# Patient Record
Sex: Male | Born: 1965 | Race: White | Hispanic: No | Marital: Married | Smoking: Never smoker
Health system: Southern US, Community
[De-identification: ages and names within clinical notes are randomized; demographics above are authoritative.]

## PROBLEM LIST (undated history)

## (undated) DIAGNOSIS — M1990 Unspecified osteoarthritis, unspecified site: Secondary | ICD-10-CM

## (undated) DIAGNOSIS — E079 Disorder of thyroid, unspecified: Secondary | ICD-10-CM

## (undated) DIAGNOSIS — I10 Essential (primary) hypertension: Secondary | ICD-10-CM

## (undated) DIAGNOSIS — T7840XA Allergy, unspecified, initial encounter: Secondary | ICD-10-CM

## (undated) DIAGNOSIS — N189 Chronic kidney disease, unspecified: Secondary | ICD-10-CM

## (undated) HISTORY — DX: Unspecified osteoarthritis, unspecified site: M19.90

## (undated) HISTORY — DX: Disorder of thyroid, unspecified: E07.9

## (undated) HISTORY — DX: Chronic kidney disease, unspecified: N18.9

## (undated) HISTORY — DX: Allergy, unspecified, initial encounter: T78.40XA

---

## 1998-06-23 HISTORY — PX: ANTERIOR CRUCIATE LIGAMENT REPAIR: SHX115

## 2011-11-25 ENCOUNTER — Encounter: Payer: BC Managed Care – PPO | Attending: "Endocrinology | Admitting: *Deleted

## 2011-11-25 DIAGNOSIS — Z713 Dietary counseling and surveillance: Secondary | ICD-10-CM

## 2011-11-29 NOTE — Progress Notes (Signed)
Patient attended basic nutrition class on 11/25/11.  Topics covered include:   1. Complications of Hyperlipidemia and/or Hypertension. 2. Ways to reduce risk of heart disease.  3. Identifying fat and sodium content on food labels. 4. Ways to decrease sodium intake. 5. Optimal amount of daily saturated fat intake. 6. Optimal amount of daily sodium intake.  7. Foods to limit/avoid on a heart healthy diet. 8. MyPlate and portion control.   Patient to follow-up with [facility] prn.  

## 2012-07-13 ENCOUNTER — Ambulatory Visit (INDEPENDENT_AMBULATORY_CARE_PROVIDER_SITE_OTHER): Payer: BC Managed Care – PPO | Admitting: Internal Medicine

## 2012-07-13 ENCOUNTER — Encounter: Payer: Self-pay | Admitting: Internal Medicine

## 2012-07-13 VITALS — BP 130/84 | HR 81 | Temp 98.5°F | Resp 18 | Ht 72.5 in | Wt 226.0 lb

## 2012-07-13 DIAGNOSIS — Z0000 Encounter for general adult medical examination without abnormal findings: Secondary | ICD-10-CM

## 2012-07-13 DIAGNOSIS — I10 Essential (primary) hypertension: Secondary | ICD-10-CM | POA: Insufficient documentation

## 2012-07-13 DIAGNOSIS — E039 Hypothyroidism, unspecified: Secondary | ICD-10-CM

## 2012-07-13 LAB — TSH: TSH: 4.84 u[IU]/mL (ref 0.35–5.50)

## 2012-07-13 MED ORDER — IRBESARTAN 300 MG PO TABS: 300.0000 mg | ORAL_TABLET | Freq: Every day | ORAL | Status: DC

## 2012-07-13 NOTE — Patient Instructions (Signed)
Limit your sodium (Salt) intake  Please check your blood pressure on a regular basis.  If it is consistently greater than 150/90, please make an office appointment.    It is important that you exercise regularly, at least 20 minutes 3 to 4 times per week.  If you develop chest pain or shortness of breath seek  medical attention.  You need to lose weight.  Consider a lower calorie diet and regular exercise.DASH Diet The DASH diet stands for "Dietary Approaches to Stop Hypertension." It is a healthy eating plan that has been shown to reduce high blood pressure (hypertension) in as little as 14 days, while also possibly providing other significant health benefits. These other health benefits include reducing the risk of breast cancer after menopause and reducing the risk of type 2 diabetes, heart disease, colon cancer, and stroke. Health benefits also include weight loss and slowing kidney failure in patients with chronic kidney disease.   DIET GUIDELINES  Limit salt (sodium). Your diet should contain less than 1500 mg of sodium daily.   Limit refined or processed carbohydrates. Your diet should include mostly whole grains. Desserts and added sugars should be used sparingly.   Include small amounts of heart-healthy fats. These types of fats include nuts, oils, and tub margarine. Limit saturated and trans fats. These fats have been shown to be harmful in the body.  CHOOSING FOODS   The following food groups are based on a 2000 calorie diet. See your Registered Dietitian for individual calorie needs. Grains and Grain Products (6 to 8 servings daily)  Eat More Often: Whole-wheat bread, brown rice, whole-grain or wheat pasta, quinoa, popcorn without added fat or salt (air popped).   Eat Less Often: White bread, white pasta, white rice, cornbread.  Vegetables (4 to 5 servings daily)  Eat More Often: Fresh, frozen, and canned vegetables. Vegetables may be raw, steamed, roasted, or grilled with a  minimal amount of fat.   Eat Less Often/Avoid: Creamed or fried vegetables. Vegetables in a cheese sauce.  Fruit (4 to 5 servings daily)  Eat More Often: All fresh, canned (in natural juice), or frozen fruits. Dried fruits without added sugar. One hundred percent fruit juice ( cup [237 mL] daily).   Eat Less Often: Dried fruits with added sugar. Canned fruit in light or heavy syrup.  Lean Meats, Fish, and Poultry (2 servings or less daily. One serving is 3 to 4 oz [85-114 g]).  Eat More Often: Ninety percent or leaner ground beef, tenderloin, sirloin. Round cuts of beef, chicken breast, turkey breast. All fish. Grill, bake, or broil your meat. Nothing should be fried.   Eat Less Often/Avoid: Fatty cuts of meat, turkey, or chicken leg, thigh, or wing. Fried cuts of meat or fish.  Dairy (2 to 3 servings)  Eat More Often: Low-fat or fat-free milk, low-fat plain or light yogurt, reduced-fat or part-skim cheese.   Eat Less Often/Avoid: Milk (whole, 2%). Whole milk yogurt. Full-fat cheeses.  Nuts, Seeds, and Legumes (4 to 5 servings per week)  Eat More Often: All without added salt.   Eat Less Often/Avoid: Salted nuts and seeds, canned beans with added salt.  Fats and Sweets (limited)  Eat More Often: Vegetable oils, tub margarines without trans fats, sugar-free gelatin. Mayonnaise and salad dressings.   Eat Less Often/Avoid: Coconut oils, palm oils, butter, stick margarine, cream, half and half, cookies, candy, pie.  FOR MORE INFORMATION The Dash Diet Eating Plan: www.dashdiet.org Document Released: 05/29/2011 Document Revised: 09/01/2011 Document   Reviewed: 05/29/2011 [employer] Patient Information 2013 [employer].    

## 2012-07-13 NOTE — Progress Notes (Signed)
Subjective:    Patient ID: Steve Olson, male    DOB: 11-Mar-1966, 47 y.o.   MRN: [id number]  HPI  47 year old patient who is seen today to establish with our practice. He has a history of treated hypertension of about one years duration. The well today without concerns or complaints. There's been some weight gain over the past year.  Past medical history is pertinent for hypertension. 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 5 children. Nonsmoker. Rare social drinker; no regular exercise  Family history father age 67  has a history of coronary artery disease paternal grandfather had an MI at age 46 mother age 31 with history of breast cancer one brother who has obesity    Review of Systems  Constitutional: Positive for unexpected weight change. Negative for fever, chills, activity change, appetite change and fatigue.  HENT: Negative for hearing loss, ear pain, congestion, rhinorrhea, sneezing, mouth sores, trouble swallowing, neck pain, neck stiffness, dental problem, voice change, sinus pressure and tinnitus.   Eyes: Negative for photophobia, pain, redness and visual disturbance.  Respiratory: Negative for apnea, cough, choking, chest tightness, shortness of breath and wheezing.   Cardiovascular: Negative for chest pain, palpitations and leg swelling.  Gastrointestinal: Negative for nausea, vomiting, abdominal pain, diarrhea, constipation, blood in stool, abdominal distention, anal bleeding and rectal pain.  Genitourinary: Negative for dysuria, urgency, frequency, hematuria, flank pain, decreased urine volume, discharge, penile swelling, scrotal swelling, difficulty urinating, genital sores and testicular pain.  Musculoskeletal: Negative for myalgias, back pain, joint swelling, arthralgias and gait problem.  Skin: Negative for color change,  rash and wound.  Neurological: Negative for dizziness, tremors, seizures, syncope, facial asymmetry, speech difficulty, weakness, light-headedness, numbness and headaches.  Hematological: Negative for adenopathy. Does not bruise/bleed easily.  Psychiatric/Behavioral: Negative for suicidal ideas, hallucinations, behavioral problems, confusion, sleep disturbance, self-injury, dysphoric mood, decreased concentration and agitation. The patient is not nervous/anxious.        Objective:   Physical Exam  Constitutional: He appears well-developed and well-nourished.       140/84  HENT:  Head: Normocephalic and atraumatic.  Right Ear: External ear normal.  Left Ear: External ear normal.  Nose: Nose normal.  Mouth/Throat: Oropharynx is clear and moist.  Eyes: Conjunctivae normal and EOM are normal. Pupils are equal, round, and reactive to light. No scleral icterus.  Neck: Normal range of motion. Neck supple. No JVD present. No thyromegaly present.  Cardiovascular: Regular rhythm, normal heart sounds and intact distal pulses.  Exam reveals no gallop and no friction rub.   No murmur heard. Pulmonary/Chest: Effort normal and breath sounds normal. He exhibits no tenderness.  Abdominal: Soft. Bowel sounds are normal. He exhibits no distension and no mass. There is no tenderness.  Genitourinary: Prostate normal and penis normal.  Musculoskeletal: Normal range of motion. He exhibits no edema and no tenderness.  Lymphadenopathy:    He has no cervical adenopathy.  Neurological: He is alert. He has normal reflexes. No cranial nerve deficit. Coordination normal.  Skin: Skin is warm and dry. No rash noted.  Psychiatric: He has a normal mood and affect. His behavior is normal.          Assessment & Plan:   Preventive health exam Hypertension. Home blood pressure monitoring encouraged low-salt diet exercise weight loss all recommended History of borderline hypothyroidism. We'll check a TSH  Exogenous obesity

## 2012-08-13 ENCOUNTER — Ambulatory Visit: Payer: BC Managed Care – PPO | Admitting: Internal Medicine

## 2012-08-18 ENCOUNTER — Emergency Department (HOSPITAL_COMMUNITY)
Admission: EM | Admit: 2012-08-18 | Discharge: 2012-08-19 | Disposition: A | Discharge status: Home / Self Care | Payer: BC Managed Care – PPO | Attending: Emergency Medicine | Admitting: Emergency Medicine

## 2012-08-18 DIAGNOSIS — Z79899 Other long term (current) drug therapy: Secondary | ICD-10-CM | POA: Insufficient documentation

## 2012-08-18 DIAGNOSIS — N200 Calculus of kidney: Secondary | ICD-10-CM

## 2012-08-18 DIAGNOSIS — R300 Dysuria: Secondary | ICD-10-CM | POA: Insufficient documentation

## 2012-08-18 DIAGNOSIS — R112 Nausea with vomiting, unspecified: Secondary | ICD-10-CM | POA: Insufficient documentation

## 2012-08-18 DIAGNOSIS — I10 Essential (primary) hypertension: Secondary | ICD-10-CM | POA: Insufficient documentation

## 2012-08-18 HISTORY — DX: Essential (primary) hypertension: I10

## 2012-08-18 LAB — CBC WITH DIFFERENTIAL/PLATELET
Basophils Absolute: 0 K/uL (ref 0.0–0.1)
Basophils Relative: 0 % (ref 0–1)
Eosinophils Absolute: 0.2 K/uL (ref 0.0–0.7)
Eosinophils Relative: 2 % (ref 0–5)
HCT: 41.4 % (ref 39.0–52.0)
Hemoglobin: 15 g/dL (ref 13.0–17.0)
Lymphocytes Relative: 16 % (ref 12–46)
Lymphs Abs: 1.6 K/uL (ref 0.7–4.0)
MCH: 33.5 pg (ref 26.0–34.0)
MCHC: 36.2 g/dL — ABNORMAL HIGH (ref 30.0–36.0)
MCV: 92.4 fL (ref 78.0–100.0)
Monocytes Absolute: 0.8 K/uL (ref 0.1–1.0)
Monocytes Relative: 7 % (ref 3–12)
Neutro Abs: 7.8 K/uL — ABNORMAL HIGH (ref 1.7–7.7)
Neutrophils Relative %: 75 % (ref 43–77)
Platelets: 280 K/uL (ref 150–400)
RBC: 4.48 MIL/uL (ref 4.22–5.81)
RDW: 12.5 % (ref 11.5–15.5)
WBC: 10.3 K/uL (ref 4.0–10.5)

## 2012-08-18 LAB — COMPREHENSIVE METABOLIC PANEL WITH GFR
ALT: 28 U/L (ref 0–53)
AST: 34 U/L (ref 0–37)
Albumin: 4.3 g/dL (ref 3.5–5.2)
Alkaline Phosphatase: 48 U/L (ref 39–117)
BUN: 22 mg/dL (ref 6–23)
CO2: 26 meq/L (ref 19–32)
Calcium: 9.6 mg/dL (ref 8.4–10.5)
Chloride: 100 meq/L (ref 96–112)
Creatinine, Ser: 1.35 mg/dL (ref 0.50–1.35)
GFR calc Af Amer: 72 mL/min — ABNORMAL LOW
GFR calc non Af Amer: 62 mL/min — ABNORMAL LOW
Glucose, Bld: 160 mg/dL — ABNORMAL HIGH (ref 70–99)
Potassium: 4.4 meq/L (ref 3.5–5.1)
Sodium: 137 meq/L (ref 135–145)
Total Bilirubin: 0.7 mg/dL (ref 0.3–1.2)
Total Protein: 7.7 g/dL (ref 6.0–8.3)

## 2012-08-18 LAB — AMYLASE: Amylase: 89 U/L (ref 0–105)

## 2012-08-18 LAB — LIPASE, BLOOD: Lipase: 31 U/L (ref 11–59)

## 2012-08-18 NOTE — ED Notes (Addendum)
Pt states constant LLQ pain that started this morning. Stomach soft. Non tender to palpation. Last BM this morning. C/o burning sensation before or after urination. PT states he feels like he has to void but is not able to. Denies hematuria. Denies penile discharge.

## 2012-08-19 ENCOUNTER — Encounter (HOSPITAL_COMMUNITY): Payer: Self-pay | Admitting: Emergency Medicine

## 2012-08-19 ENCOUNTER — Emergency Department (HOSPITAL_COMMUNITY): Payer: BC Managed Care – PPO

## 2012-08-19 LAB — URINE MICROSCOPIC-ADD ON

## 2012-08-19 LAB — URINALYSIS, ROUTINE W REFLEX MICROSCOPIC
Bilirubin Urine: NEGATIVE
Glucose, UA: NEGATIVE mg/dL
Ketones, ur: 15 mg/dL — AB
Leukocytes, UA: NEGATIVE
Nitrite: NEGATIVE
Protein, ur: NEGATIVE mg/dL
Specific Gravity, Urine: 1.017 (ref 1.005–1.030)
Urobilinogen, UA: 0.2 mg/dL (ref 0.0–1.0)
pH: 5.5 (ref 5.0–8.0)

## 2012-08-19 MED ORDER — ONDANSETRON HCL 4 MG/2ML IJ SOLN: 4.0000 mg | Freq: Once | INTRAMUSCULAR | Status: DC

## 2012-08-19 MED ORDER — TAMSULOSIN HCL 0.4 MG PO CAPS: 0.4000 mg | ORAL_CAPSULE | Freq: Every day | ORAL | Status: DC

## 2012-08-19 MED ORDER — SODIUM CHLORIDE 0.9 % IV SOLN
INTRAVENOUS | Status: DC
  Administered 2012-08-19: 01:00:00 via INTRAVENOUS

## 2012-08-19 MED ORDER — OXYCODONE-ACETAMINOPHEN 5-325 MG PO TABS: 2.0000 | ORAL_TABLET | ORAL | Status: DC | PRN

## 2012-08-19 MED ORDER — KETOROLAC TROMETHAMINE 30 MG/ML IJ SOLN
30.0000 mg | Freq: Once | INTRAMUSCULAR | Status: AC
  Administered 2012-08-19: 30 mg via INTRAVENOUS
  Filled 2012-08-19: qty 1

## 2012-08-19 MED ORDER — ONDANSETRON HCL 4 MG PO TABS: 4.0000 mg | ORAL_TABLET | Freq: Four times a day (QID) | ORAL | Status: DC

## 2012-08-19 MED ORDER — HYDROMORPHONE HCL PF 1 MG/ML IJ SOLN: 1.0000 mg | Freq: Once | INTRAMUSCULAR | Status: DC

## 2012-08-19 NOTE — ED Notes (Signed)
Patient transported to A-10 from CT.

## 2012-08-19 NOTE — ED Provider Notes (Signed)
History     CSN: [id number]  Arrival date & time 08/18/12  2355   First MD Initiated Contact with Patient 08/19/12 0023      Chief Complaint  Patient presents with  . Abdominal Pain    (Consider location/radiation/quality/duration/timing/severity/associated sxs/prior treatment) HPI Hx per PT. L flank pain. Onset yesterday. Mild in severity, cramping and migrating pain. Tonight became severe, doubled over sharp pain, radiates to LLQ ABD.  N/V with severe pain and unable to get comfortable. No hematuria.  No F/C.  Currently pain free. No h/o kidney stones.  No diarrhea or blood in stools, some dysuria yesterday.  Past Medical History  Diagnosis Date  . Hypertension     Past Surgical History  Procedure Laterality Date  . Anterior cruciate ligament repair  2000    History reviewed. No pertinent family history.  History  Substance Use Topics  . Smoking status: Never Smoker   . Smokeless tobacco: Never Used  . Alcohol Use: No      Review of Systems  Constitutional: Negative for fever and chills.  HENT: Negative for neck pain and neck stiffness.   Eyes: Negative for pain.  Respiratory: Negative for shortness of breath.   Cardiovascular: Negative for chest pain.  Gastrointestinal: Positive for nausea and vomiting. Negative for abdominal distention.  Genitourinary: Positive for flank pain. Negative for dysuria.  Musculoskeletal: Negative for back pain.  Skin: Negative for rash.  Neurological: Negative for headaches.  All other systems reviewed and are negative.    Allergies  Penicillins  Home Medications   Current Outpatient Rx  Name  Route  Sig  Dispense  Refill  . aspirin-acetaminophen-caffeine (EXCEDRIN MIGRAINE) 250-250-65 MG per tablet   Oral   Take 1 tablet by mouth every 6 (six) hours as needed for pain (headache).         Marland Kitchen ibuprofen (ADVIL) 200 MG tablet   Oral   Take 200 mg by mouth every 6 (six) hours as needed.          . irbesartan (AVAPRO)  300 MG tablet   Oral   Take 1 tablet (300 mg total) by mouth at bedtime.   90 tablet   6     BP 126/88  Pulse 85  Temp(Src) 99.1 F (37.3 C) (Oral)  Resp 16  SpO2 99%  Physical Exam  Constitutional: He is oriented to person, place, and time. He appears well-developed and well-nourished.  HENT:  Head: Normocephalic and atraumatic.  Eyes: EOM are normal. Pupils are equal, round, and reactive to light.  Neck: Neck supple.  Cardiovascular: Normal rate, regular rhythm and intact distal pulses.   Pulmonary/Chest: Effort normal and breath sounds normal. No respiratory distress.  Abdominal: Soft. Bowel sounds are normal. He exhibits no distension and no mass. There is no tenderness. There is no rebound and no guarding.  No CVAT  Musculoskeletal: Normal range of motion. He exhibits no edema.  Neurological: He is alert and oriented to person, place, and time.  Skin: Skin is warm and dry.    ED Course  Procedures (including critical care time)   Results for orders placed during the hospital encounter of 08/18/12  CBC WITH DIFFERENTIAL      Result Value Range   WBC 10.3  4.0 - 10.5 K/uL   RBC 4.48  4.22 - 5.81 MIL/uL   Hemoglobin 15.0  13.0 - 17.0 g/dL   HCT 40.9  81.1 - 91.4 %   MCV 92.4  78.0 - 100.0 fL  MCH 33.5  26.0 - 34.0 pg   MCHC 36.2 (*) 30.0 - 36.0 g/dL   RDW 40.9  81.1 - 91.4 %   Platelets 280  150 - 400 K/uL   Neutrophils Relative 75  43 - 77 %   Neutro Abs 7.8 (*) 1.7 - 7.7 K/uL   Lymphocytes Relative 16  12 - 46 %   Lymphs Abs 1.6  0.7 - 4.0 K/uL   Monocytes Relative 7  3 - 12 %   Monocytes Absolute 0.8  0.1 - 1.0 K/uL   Eosinophils Relative 2  0 - 5 %   Eosinophils Absolute 0.2  0.0 - 0.7 K/uL   Basophils Relative 0  0 - 1 %   Basophils Absolute 0.0  0.0 - 0.1 K/uL  COMPREHENSIVE METABOLIC PANEL      Result Value Range   Sodium 137  135 - 145 mEq/L   Potassium 4.4  3.5 - 5.1 mEq/L   Chloride 100  96 - 112 mEq/L   CO2 26  19 - 32 mEq/L   Glucose, Bld  160 (*) 70 - 99 mg/dL   BUN 22  6 - 23 mg/dL   Creatinine, Ser 7.82  0.50 - 1.35 mg/dL   Calcium 9.6  8.4 - 95.6 mg/dL   Total Protein 7.7  6.0 - 8.3 g/dL   Albumin 4.3  3.5 - 5.2 g/dL   AST 34  0 - 37 U/L   ALT 28  0 - 53 U/L   Alkaline Phosphatase 48  39 - 117 U/L   Total Bilirubin 0.7  0.3 - 1.2 mg/dL   GFR calc non Af Amer 62 (*) >90 mL/min   GFR calc Af Amer 72 (*) >90 mL/min  LIPASE, BLOOD      Result Value Range   Lipase 31  11 - 59 U/L  URINALYSIS, ROUTINE W REFLEX MICROSCOPIC      Result Value Range   Color, Urine YELLOW  YELLOW   APPearance CLEAR  CLEAR   Specific Gravity, Urine 1.017  1.005 - 1.030   pH 5.5  5.0 - 8.0   Glucose, UA NEGATIVE  NEGATIVE mg/dL   Hgb urine dipstick LARGE (*) NEGATIVE   Bilirubin Urine NEGATIVE  NEGATIVE   Ketones, ur 15 (*) NEGATIVE mg/dL   Protein, ur NEGATIVE  NEGATIVE mg/dL   Urobilinogen, UA 0.2  0.0 - 1.0 mg/dL   Nitrite NEGATIVE  NEGATIVE   Leukocytes, UA NEGATIVE  NEGATIVE  AMYLASE      Result Value Range   Amylase 89  0 - 105 U/L  URINE MICROSCOPIC-ADD ON      Result Value Range   WBC, UA 0-2  <3 WBC/hpf   RBC / HPF 11-20  <3 RBC/hpf   Bacteria, UA RARE  RARE   Urine-Other MUCOUS PRESENT     Ct Abdomen Pelvis Wo Contrast  08/19/2012  *RADIOLOGY REPORT*  Clinical Data: Left flank pain and pain with urination.  Nausea. Left lower quadrant pain.  CT ABDOMEN AND PELVIS WITHOUT CONTRAST  Technique:  Multidetector CT imaging of the abdomen and pelvis was performed following the standard protocol without intravenous contrast.  Comparison: None.  Findings: Minimal dependent changes in the lung bases.  There is a 2 mm stone in the distal left ureter just above the ureterovesicle junction with mild proximal ureterectasis and pyelocaliectasis suggesting partially obstructing stone.  No additional renal or ureteral stones are demonstrated.  The right renal collecting system and ureter are  decompressed.  The bladder is decompressed.  The  unenhanced appearance of the liver, spleen, gallbladder, pancreas, adrenal glands, abdominal aorta, and retroperitoneal lymph nodes is unremarkable.  The stomach, small bowel, and colon are not abnormally distended.  No free air or free fluid in the abdomen.  Pelvis:  Mild enlargement of the prostate gland, measuring 5.1 x 3.8 cm.  Calcifications in the prostate gland. There are no free or loculated pelvic fluid collections.  No abnormal pelvic lymphadenopathy.  No diverticulitis.  Appendix is normal.  Normal alignment of the lumbar vertebrae.  Mild degenerative changes in the lumbar spine.  IMPRESSION: 2 mm stone in the distal left ureter with mild proximal obstructive changes.   Original Report Authenticated By: Burman Nieves, M.D.     IVFs. IV toradol.   Recheck: pain free in the ER   Plan urology referral, pain medications as needed and flomax.  PT given a strainer and agrees to return precautions.   MDM  Flank pain/ kidney sone  CT, UA, IVFs, IV toradol  VS, nursing notes reviewed  Serial evaluations - improved condition        Sunnie Nielsen, MD 08/20/12 [phone]

## 2012-08-19 NOTE — ED Notes (Signed)
Patient says he was having painful before/after urination but not during urination.  Patient said this started on Tuesday.  On Wednesday, the patient said his stomach started to hurt and it got progressively worse and he also got nauseated.  Patient said he never vomited but felt nauseous.  The pain was severe so he decided to come and be seen.  Patient has not had anything to eat for 12hrs.  He has had water and was able to keep it down.

## 2012-08-19 NOTE — ED Notes (Signed)
Patient is alert and orientedx4.  Patient was explained discharge instructions and they understood them with no questions.  Danette Kihara is taking patient home.

## 2013-03-15 IMAGING — CT CT ABD-PELV W/O CM
1 of 2 series · 15 of 32 positions shown, 19 images · non-contrast
Comparison: None.

CLINICAL DATA: Left flank pain and pain with urination.  Nausea.
Left lower quadrant pain.

CT ABDOMEN AND PELVIS WITHOUT CONTRAST
TECHNIQUE: Multidetector CT imaging of the abdomen and pelvis was
performed following the standard protocol without intravenous
contrast.

[Series 2: stone 160 5.0 b31f st · axial · 0.74mm/px · z∈[-494,-54]mm · 15 of 96 slices shown, 19 images]
[im 4/96  soft-tissue]
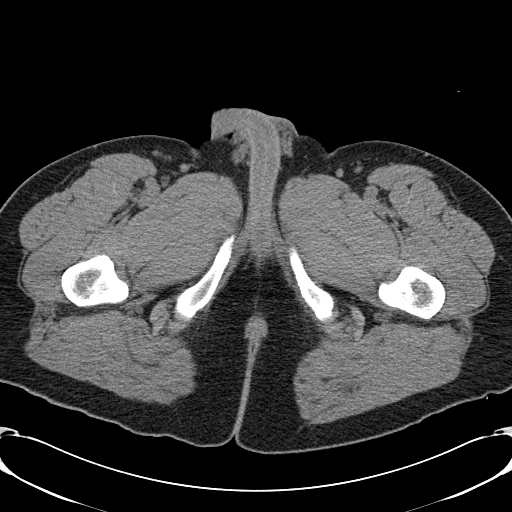
[im 4/96  bone]
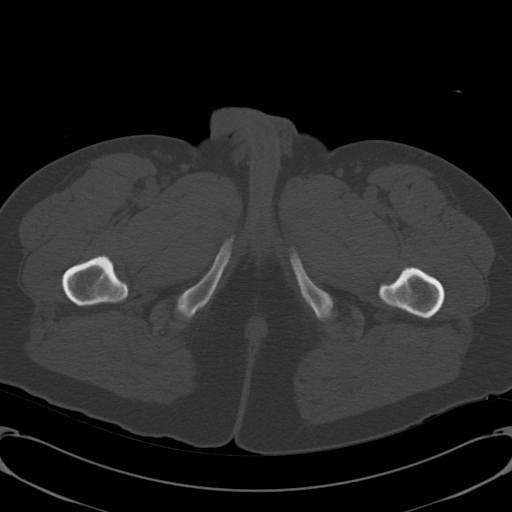
[im 12/96  soft-tissue]
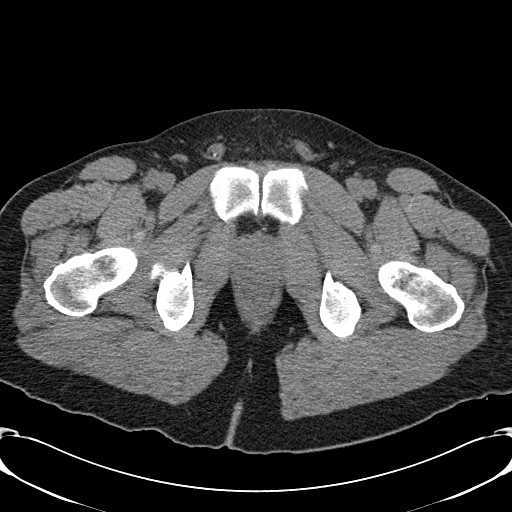
[im 20/96  soft-tissue]
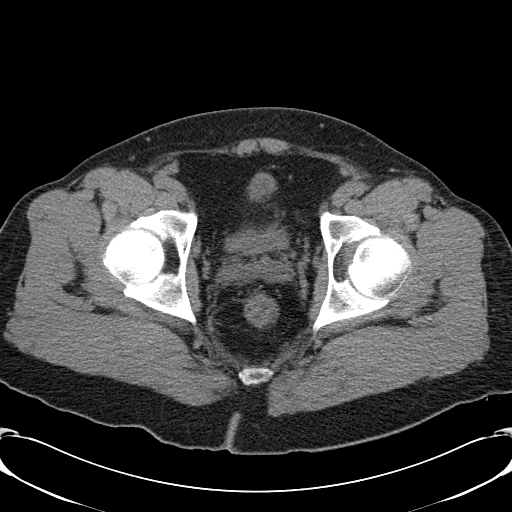
[im 27/96  soft-tissue]
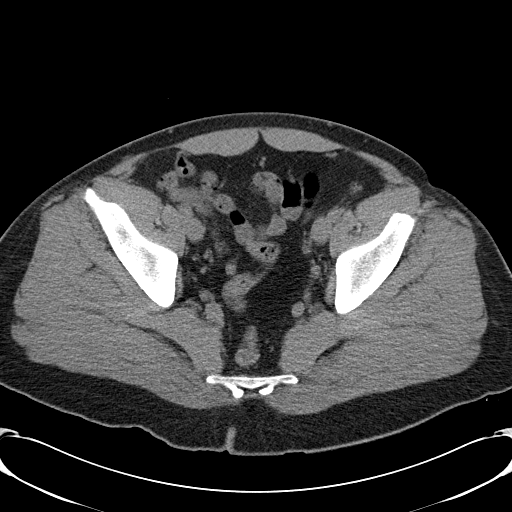
[im 35/96  soft-tissue]
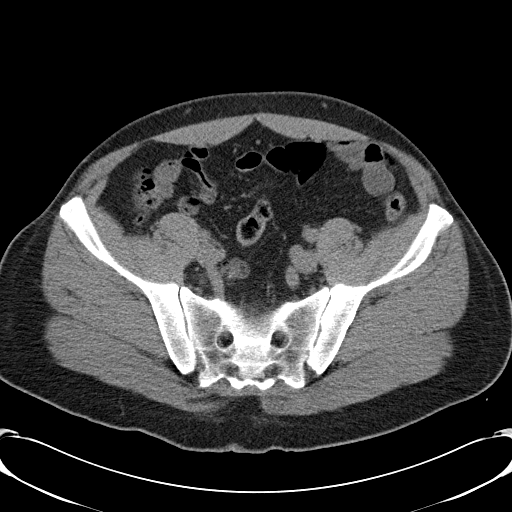
[im 42/96  soft-tissue]
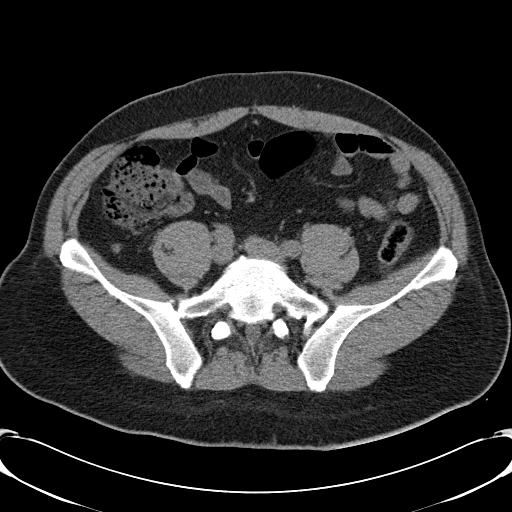
[im 50/96  soft-tissue]
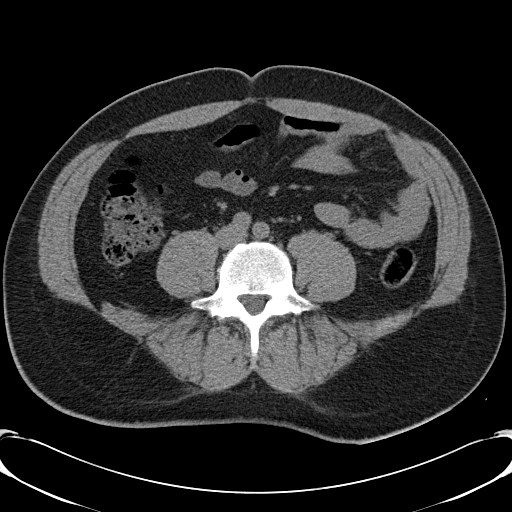
[im 54/96  soft-tissue]
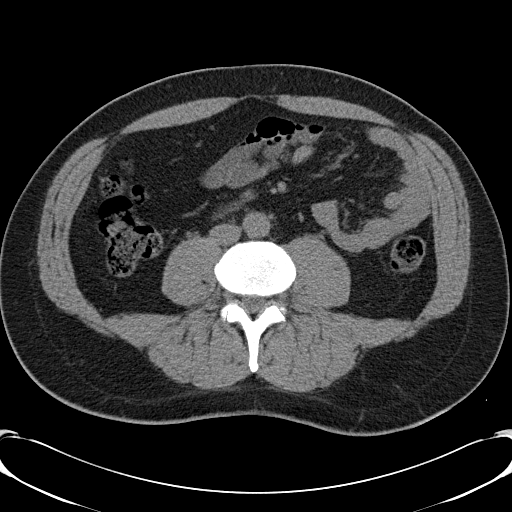
[im 61/96  soft-tissue]
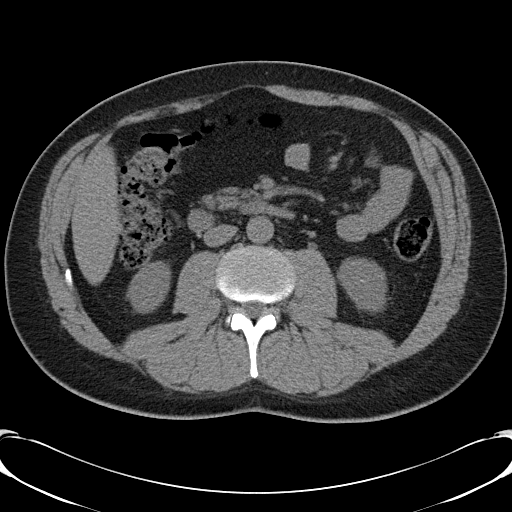
[im 61/96  bone]
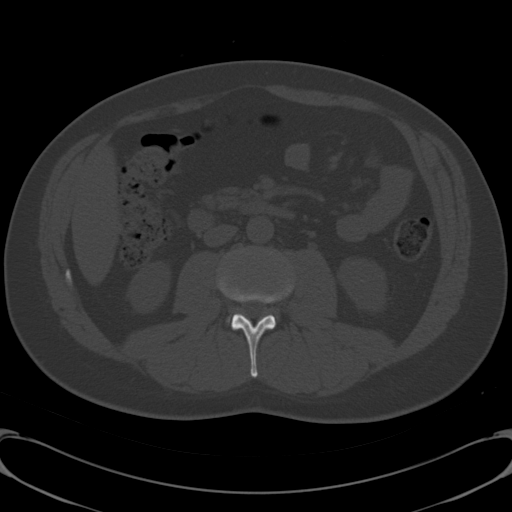
[im 69/96  soft-tissue]
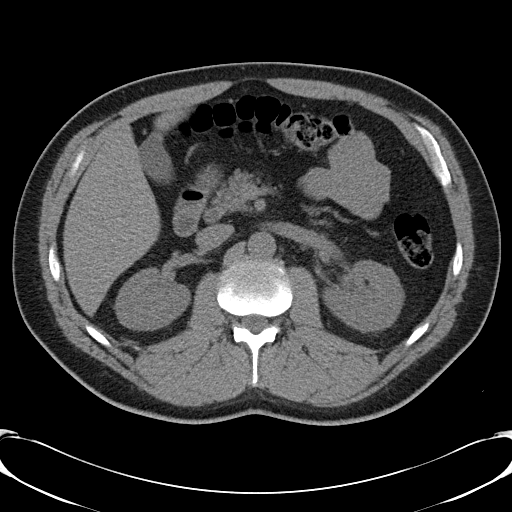
[im 77/96  soft-tissue]
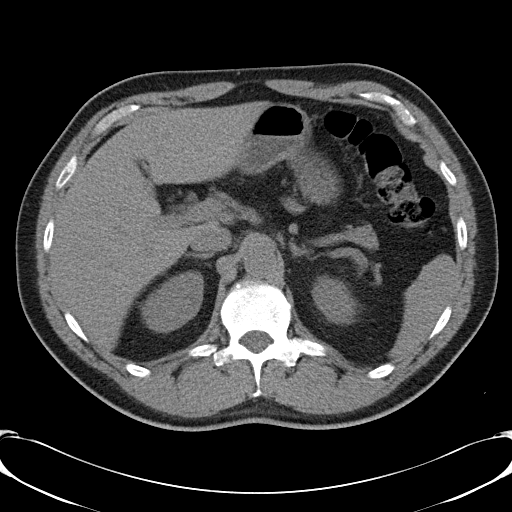
[im 80/96  lung]
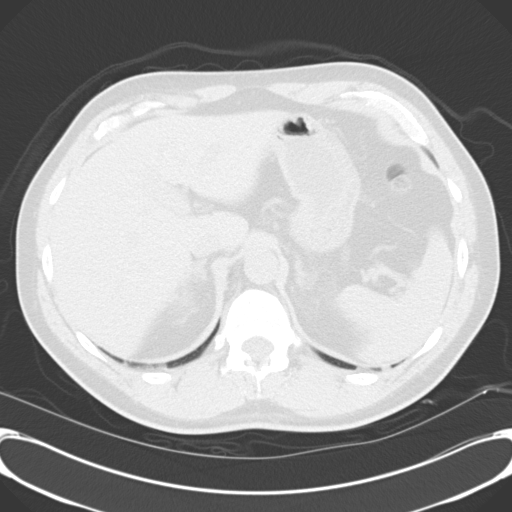
[im 84/96  soft-tissue]
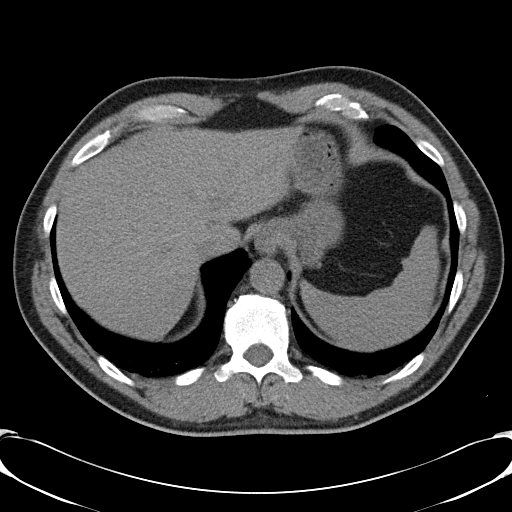
[im 84/96  lung]
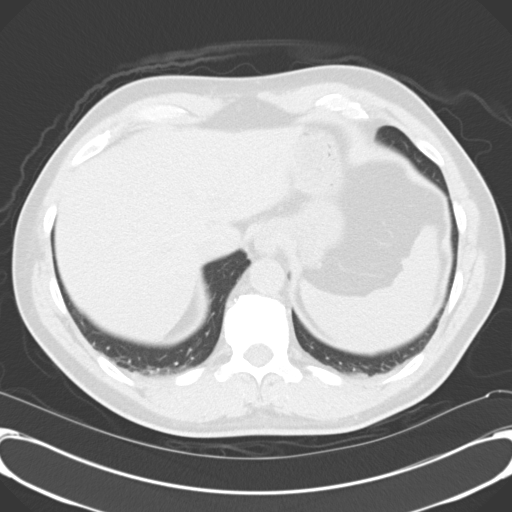
[im 88/96  lung]
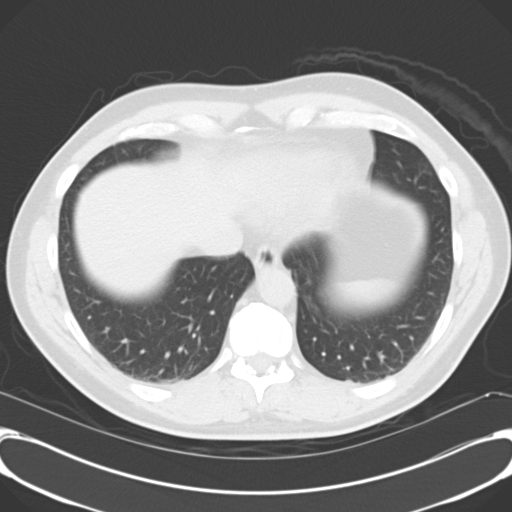
[im 92/96  soft-tissue]
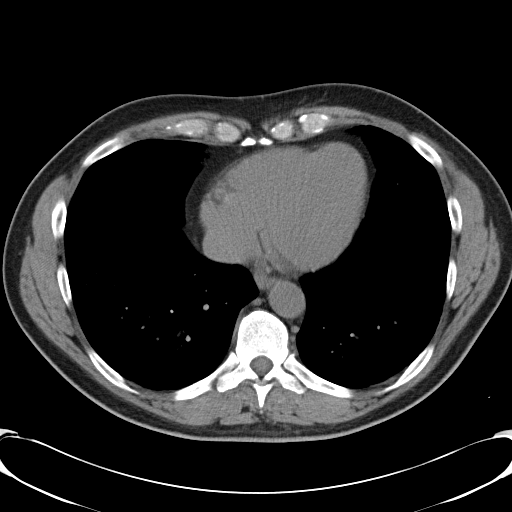
[im 92/96  lung]
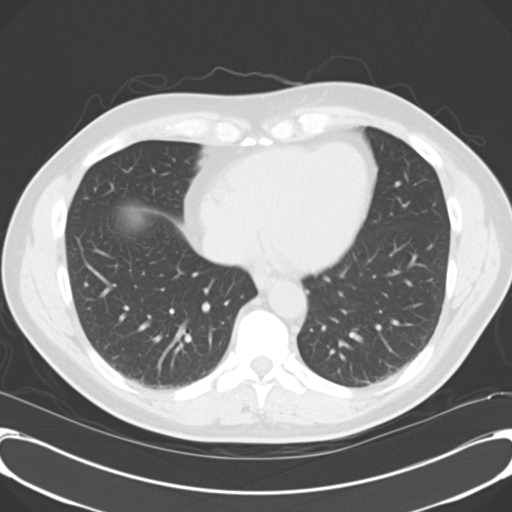

[15 of 32 positions shown; findings below may reference images not displayed]

FINDINGS: Minimal dependent changes in the lung bases.

There is a 2 mm stone in the distal left ureter just above the
ureterovesicle junction with mild proximal ureterectasis and
pyelocaliectasis suggesting partially obstructing stone.  No
additional renal or ureteral stones are demonstrated.  The right
renal collecting system and ureter are decompressed.  The bladder
is decompressed.

The unenhanced appearance of the liver, spleen, gallbladder,
pancreas, adrenal glands, abdominal aorta, and retroperitoneal
lymph nodes is unremarkable.  The stomach, small bowel, and colon
are not abnormally distended.  No free air or free fluid in the
abdomen.

Pelvis:  Mild enlargement of the prostate gland, measuring 5.1 x
3.8 cm.  Calcifications in the prostate gland. There are no free or
loculated pelvic fluid collections.  No abnormal pelvic
lymphadenopathy.  No diverticulitis.  Appendix is normal.  Normal
alignment of the lumbar vertebrae.  Mild degenerative changes in
the lumbar spine.
IMPRESSION: 2 mm stone in the distal left ureter with mild proximal obstructive
changes.

## 2013-07-27 ENCOUNTER — Other Ambulatory Visit (INDEPENDENT_AMBULATORY_CARE_PROVIDER_SITE_OTHER): Payer: BC Managed Care – PPO

## 2013-07-27 DIAGNOSIS — Z0000 Encounter for general adult medical examination without abnormal findings: Secondary | ICD-10-CM

## 2013-07-27 LAB — POCT URINALYSIS DIPSTICK
Bilirubin, UA: NEGATIVE
Blood, UA: NEGATIVE
Glucose, UA: NEGATIVE
Ketones, UA: NEGATIVE
Leukocytes, UA: NEGATIVE
Nitrite, UA: NEGATIVE
Protein, UA: NEGATIVE
Spec Grav, UA: 1.02
Urobilinogen, UA: 1
pH, UA: 7

## 2013-07-27 LAB — CBC WITH DIFFERENTIAL/PLATELET
Basophils Absolute: 0 10*3/uL (ref 0.0–0.1)
Basophils Relative: 0.3 % (ref 0.0–3.0)
Eosinophils Absolute: 0.2 10*3/uL (ref 0.0–0.7)
Eosinophils Relative: 2 % (ref 0.0–5.0)
HCT: 47.2 % (ref 39.0–52.0)
Hemoglobin: 15.6 g/dL (ref 13.0–17.0)
Lymphocytes Relative: 19.5 % (ref 12.0–46.0)
Lymphs Abs: 1.6 10*3/uL (ref 0.7–4.0)
MCHC: 33.1 g/dL (ref 30.0–36.0)
MCV: 98.7 fl (ref 78.0–100.0)
Monocytes Absolute: 0.6 10*3/uL (ref 0.1–1.0)
Monocytes Relative: 7.6 % (ref 3.0–12.0)
Neutro Abs: 5.6 10*3/uL (ref 1.4–7.7)
Neutrophils Relative %: 70.6 % (ref 43.0–77.0)
Platelets: 284 10*3/uL (ref 150.0–400.0)
RBC: 4.78 Mil/uL (ref 4.22–5.81)
RDW: 13.1 % (ref 11.5–14.6)
WBC: 8 10*3/uL (ref 4.5–10.5)

## 2013-07-27 LAB — HEPATIC FUNCTION PANEL
ALT: 33 U/L (ref 0–53)
AST: 29 U/L (ref 0–37)
Albumin: 4.5 g/dL (ref 3.5–5.2)
Alkaline Phosphatase: 48 U/L (ref 39–117)
Bilirubin, Direct: 0.1 mg/dL (ref 0.0–0.3)
Total Bilirubin: 1.4 mg/dL — ABNORMAL HIGH (ref 0.3–1.2)
Total Protein: 7.7 g/dL (ref 6.0–8.3)

## 2013-07-27 LAB — BASIC METABOLIC PANEL
BUN: 19 mg/dL (ref 6–23)
CO2: 29 mEq/L (ref 19–32)
Calcium: 9.3 mg/dL (ref 8.4–10.5)
Chloride: 102 mEq/L (ref 96–112)
Creatinine, Ser: 1.2 mg/dL (ref 0.4–1.5)
GFR: 71.81 mL/min (ref >60.00)
Glucose, Bld: 89 mg/dL (ref 70–99)
Potassium: 4.4 mEq/L (ref 3.5–5.1)
Sodium: 138 mEq/L (ref 135–145)

## 2013-07-27 LAB — LIPID PANEL
Cholesterol: 200 mg/dL (ref 0–200)
HDL: 43.7 mg/dL (ref >39.00)
LDL Cholesterol: 126 mg/dL — ABNORMAL HIGH (ref 0–99)
Total CHOL/HDL Ratio: 5
Triglycerides: 152 mg/dL — ABNORMAL HIGH (ref 0.0–149.0)
VLDL: 30.4 mg/dL (ref 0.0–40.0)

## 2013-07-27 LAB — TSH: TSH: 13.25 u[IU]/mL — ABNORMAL HIGH (ref 0.35–5.50)

## 2013-08-05 ENCOUNTER — Ambulatory Visit (INDEPENDENT_AMBULATORY_CARE_PROVIDER_SITE_OTHER): Payer: BC Managed Care – PPO | Admitting: Internal Medicine

## 2013-08-05 ENCOUNTER — Encounter: Payer: Self-pay | Admitting: Internal Medicine

## 2013-08-05 VITALS — BP 124/84 | HR 98 | Temp 98.2°F | Wt 226.0 lb

## 2013-08-05 DIAGNOSIS — E039 Hypothyroidism, unspecified: Secondary | ICD-10-CM

## 2013-08-05 DIAGNOSIS — I10 Essential (primary) hypertension: Secondary | ICD-10-CM

## 2013-08-05 DIAGNOSIS — M25519 Pain in unspecified shoulder: Secondary | ICD-10-CM

## 2013-08-05 DIAGNOSIS — H6120 Impacted cerumen, unspecified ear: Secondary | ICD-10-CM

## 2013-08-05 MED ORDER — LEVOTHYROXINE SODIUM 50 MCG PO TABS: 50.0000 ug | ORAL_TABLET | Freq: Every day | ORAL | Status: DC

## 2013-08-05 NOTE — Patient Instructions (Addendum)
Limit your sodium (Salt) intake    It is important that you exercise regularly, at least 20 minutes 3 to 4 times per week.  If you develop chest pain or shortness of breath seek  medical attention.  You need to lose weight.  Consider a lower calorie diet and regular exercise.  Return in 6-8 weeks for a followup thyroid testHealth Maintenance, Males A healthy lifestyle and preventative care can promote health and wellness.  Maintain regular health, dental, and eye exams.  Eat a healthy diet. Foods like vegetables, fruits, whole grains, low-fat dairy products, and lean protein foods contain the nutrients you need and are low in calories. Decrease your intake of foods high in solid fats, added sugars, and salt. Get information about a proper diet from your health care provider, if necessary.  Regular physical exercise is one of the most important things you can do for your health. Most adults should get at least 150 minutes of moderate-intensity exercise (any activity that increases your heart rate and causes you to sweat) each week. In addition, most adults need muscle-strengthening exercises on 2 or more days a week.   Maintain a healthy weight. The body mass index (BMI) is a screening tool to identify possible weight problems. It provides an estimate of body fat based on height and weight. Your health care provider can find your BMI and can help you achieve or maintain a healthy weight. For males 20 years and older:  A BMI below 18.5 is considered underweight.  A BMI of 18.5 to 24.9 is normal.  A BMI of 25 to 29.9 is considered overweight.  A BMI of 30 and above is considered obese.  Maintain normal blood lipids and cholesterol by exercising and minimizing your intake of saturated fat. Eat a balanced diet with plenty of fruits and vegetables. Blood tests for lipids and cholesterol should begin at age 47 and be repeated every 5 years. If your lipid or cholesterol levels are high, you are  over 50, or you are at high risk for heart disease, you may need your cholesterol levels checked more frequently.Ongoing high lipid and cholesterol levels should be treated with medicines, if diet and exercise are not working.  If you smoke, find out from your health care provider how to quit. If you do not use tobacco, do not start.  Lung cancer screening is recommended for adults aged 47 80 years who are at high risk for developing lung cancer because of a history of smoking. A yearly low-dose CT scan of the lungs is recommended for people who have at least a 30-pack-year history of smoking and are a current smoker or have quit within the past 15 years. A pack year of smoking is smoking an average of 1 pack of cigarettes a day for 1 year (for example, a 30-pack-year history of smoking could mean smoking 1 pack a day for 30 years or 2 packs a day for 15 years). Yearly screening should continue until the smoker has stopped smoking for at least 15 years. Yearly screening should be stopped for people who develop a health problem that would prevent them from having lung cancer treatment.  If you choose to drink alcohol, do not have more than 2 drinks per day. One drink is considered to be 12 oz (360 mL) of beer, 5 oz (150 mL) of wine, or 1.5 oz (45 mL) of liquor.  Avoid use of street drugs. Do not share needles with anyone. Ask for help if  you need support or instructions about stopping the use of drugs.  High blood pressure causes heart disease and increases the risk of stroke. Blood pressure should be checked at least every 1 2 years. Ongoing high blood pressure should be treated with medicines if weight loss and exercise are not effective.  If you are 66 47 years old, ask your health care provider if you should take aspirin to prevent heart disease.  Diabetes screening involves taking a blood sample to check your fasting blood sugar level. This should be done once every 3 years after age 59, if you  are at a normal weight and without risk factors for diabetes. Testing should be considered at a younger age or be carried out more frequently if you are overweight and have at least 1 risk factor for diabetes.  Colorectal cancer can be detected and often prevented. Most routine colorectal cancer screening begins at the age of 85 and continues through age 27. However, your health care provider may recommend screening at an earlier age if you have risk factors for colon cancer. On a yearly basis, your health care provider may provide home test kits to check for hidden blood in the stool. A small camera at the end of a tube may be used to directly examine the colon (sigmoidoscopy or colonoscopy) to detect the earliest forms of colorectal cancer. Talk to your health care provider about this at age 46, when routine screening begins. A direct exam of the colon should be repeated every 5 10 years through age 79, unless early forms of pre-cancerous polyps or small growths are found.  People who are at an increased risk for hepatitis B should be screened for this virus. You are considered at high risk for hepatitis B if:  You were born in a country where hepatitis B occurs often. Talk with your health care provider about which countries are considered high-risk.  Your parents were born in a high-risk country and you have not received a shot to protect against hepatitis B (hepatitis B vaccine).  You have HIV or AIDS.  You use needles to inject street drugs.  You live with, or have sex with, someone who has hepatitis B.  You are a man who has sex with other men (MSM).  You get hemodialysis treatment.  You take certain medicines for conditions like cancer, organ transplantation, and autoimmune conditions.  Hepatitis C blood testing is recommended for all people born from [birth year] through 1964 and any individual with known risk factors for hepatitis C.  Healthy men should no longer receive prostate-specific  antigen (PSA) blood tests as part of routine cancer screening. Talk to your health care provider about prostate cancer screening.  Testicular cancer screening is not recommended for adolescents or adult males who have no symptoms. Screening includes self-exam, a health care provider exam, and other screening tests. Consult with your health care provider about any symptoms you have or any concerns you have about testicular cancer.  Practice safe sex. Use condoms and avoid high-risk sexual practices to reduce the spread of sexually transmitted infections (STIs).  Use sunscreen. Apply sunscreen liberally and repeatedly throughout the day. You should seek shade when your shadow is shorter than you. Protect yourself by wearing long sleeves, pants, a wide-brimmed hat, and sunglasses year round, whenever you are outdoors.  Tell your health care provider of new moles or changes in moles, especially if there is a change in shape or color. Also tell your provider if a  mole is larger than the size of a pencil eraser.  A one-time screening for abdominal aortic aneurysm (AAA) and surgical repair of large AAAs by ultrasound is recommended for men aged 42 75 years who are current or former smokers.  Stay current with your vaccines (immunizations). Document Released: 12/06/2007 Document Revised: 03/30/2013 Document Reviewed: 11/04/2010 [facility] Patient Information 2014 [place], [place].

## 2013-08-05 NOTE — Progress Notes (Signed)
Pre visit review using our clinic review tool, if applicable. No additional management support is needed unless otherwise documented below in the visit note. 

## 2013-08-05 NOTE — Progress Notes (Signed)
Subjective:    Patient ID: Steve Olson, male    DOB: January 17, 1966, 48 y.o.   MRN: [id number]  HPI   48 year old patient who has a history of treated hypertension.  The past week or so.  He has had bilateral mild shoulder discomfort with movement.  His father has coronary artery disease, as well as PID and was concerned about shoulder pain been a symptom of a cardiac condition.  He also has a history of an elevated TSH in the past.  One year ago.  TSH was normal.  He did have a laboratory screen one week ago that revealed an elevated TSH.  He does complain of some fatigue and weight gain.  Past Medical History  Diagnosis Date  . Hypertension     History   Social History  . Marital Status: Married    Spouse Name: N/A    Number of Children: N/A  . Years of Education: N/A   Occupational History  . Not on file.   Social History Main Topics  . Smoking status: Never Smoker   . Smokeless tobacco: Never Used  . Alcohol Use: No  . Drug Use: No  . Sexual Activity: Not on file   Other Topics Concern  . Not on file   Social History Narrative  . No narrative on file    Past Surgical History  Procedure Laterality Date  . Anterior cruciate ligament repair  2000    No family history on file.  Allergies  Allergen Reactions  . Penicillins Other (See Comments)    Family allergic pt does not take    Current Outpatient Prescriptions on File Prior to Visit  Medication Sig Dispense Refill  . aspirin-acetaminophen-caffeine (EXCEDRIN MIGRAINE) 250-250-65 MG per tablet Take 1 tablet by mouth every 6 (six) hours as needed for pain (headache).      Marland Kitchen ibuprofen (ADVIL) 200 MG tablet Take 200 mg by mouth every 6 (six) hours as needed.       . irbesartan (AVAPRO) 300 MG tablet Take 1 tablet (300 mg total) by mouth at bedtime.  90 tablet  6   No current facility-administered medications on file prior to visit.    BP 124/84  Pulse 98  Temp(Src) 98.2 F (36.8 C) (Oral)  Wt 226 lb (102.513  kg)  SpO2 99%       Review of Systems  Constitutional: Positive for activity change, appetite change and fatigue.  Musculoskeletal:       Bilateral shoulder discomfort       Objective:   Physical Exam  Constitutional: He is oriented to person, place, and time. He appears well-developed.  HENT:  Head: Normocephalic.  Right Ear: External ear normal.  Left Ear: External ear normal.  Cerumen left canal  Eyes: Conjunctivae and EOM are normal.  Neck: Normal range of motion.  Cardiovascular: Normal rate, normal heart sounds and intact distal pulses.   Pulmonary/Chest: Breath sounds normal.  Abdominal: Bowel sounds are normal.  Musculoskeletal: Normal range of motion. He exhibits no edema and no tenderness.  Very mild anterior shoulder tenderness with vigorous palpation  Neurological: He is alert and oriented to person, place, and time.  Psychiatric: He has a normal mood and affect. His behavior is normal.          Assessment & Plan:   Shoulder pain. Hypertension well controlled Serum impaction.  Left canal.  Will irrigate until clear Hypothyroidism.  We'll check a TSH in 6 Weeks and Pl. on levothyroxine  0.05 mg daily  Primary prevention discussed at length.  He will attempt a more vigorous exercise regimen and modest weight loss

## 2013-08-08 ENCOUNTER — Telehealth: Payer: Self-pay | Admitting: Internal Medicine

## 2013-08-08 ENCOUNTER — Encounter: Payer: BC Managed Care – PPO | Admitting: Internal Medicine

## 2013-08-08 NOTE — Telephone Encounter (Signed)
Relevant patient education assigned to patient using Emmi. ° °

## 2013-08-18 ENCOUNTER — Encounter: Payer: BC Managed Care – PPO | Admitting: Internal Medicine

## 2013-08-24 ENCOUNTER — Other Ambulatory Visit: Payer: Self-pay | Admitting: Internal Medicine

## 2013-10-11 ENCOUNTER — Ambulatory Visit (INDEPENDENT_AMBULATORY_CARE_PROVIDER_SITE_OTHER): Payer: BC Managed Care – PPO | Admitting: Internal Medicine

## 2013-10-11 ENCOUNTER — Encounter: Payer: Self-pay | Admitting: Internal Medicine

## 2013-10-11 VITALS — BP 120/88 | HR 66 | Temp 98.2°F | Resp 20 | Ht 72.75 in | Wt 224.0 lb

## 2013-10-11 DIAGNOSIS — Z0000 Encounter for general adult medical examination without abnormal findings: Secondary | ICD-10-CM

## 2013-10-11 DIAGNOSIS — E039 Hypothyroidism, unspecified: Secondary | ICD-10-CM

## 2013-10-11 DIAGNOSIS — N200 Calculus of kidney: Secondary | ICD-10-CM

## 2013-10-11 LAB — TSH: TSH: 3.24 u[IU]/mL (ref 0.35–5.50)

## 2013-10-11 NOTE — Progress Notes (Signed)
Subjective:    Patient ID: Steve Olson, male    DOB: 11-10-1965, 48 y.o.   MRN: [id number]  HPI  48 year old patient who is seen today for a health maintenance exam.  Past medical history is pertinent for hypertension. He has had a prior anterior cruciate ligament repair involving the left knee approximately 14 years ago .  He was evaluated by urology in February of last year due to nephrolithiasis.    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 5 children. Nonsmoker. Rare social drinker; no regular exercise  Family history father age 47 has a history of coronary artery disease paternal grandfather had an MI at age 47 mother age 47 with history of breast cancer one brother who has obesity   Past Medical History  Diagnosis Date  . Hypertension     History   Social History  . Marital Status: Married    Spouse Name: N/A    Number of Children: N/A  . Years of Education: N/A   Occupational History  . Not on file.   Social History Main Topics  . Smoking status: Never Smoker   . Smokeless tobacco: Never Used  . Alcohol Use: No  . Drug Use: No  . Sexual Activity: Not on file   Other Topics Concern  . Not on file   Social History Narrative  . No narrative on file    Past Surgical History  Procedure Laterality Date  . Anterior cruciate ligament repair  2000    No family history on file.  Allergies  Allergen Reactions  . Penicillins Other (See Comments)    Family allergic pt does not take    Current Outpatient Prescriptions on File Prior to Visit  Medication Sig Dispense Refill  . aspirin-acetaminophen-caffeine (EXCEDRIN MIGRAINE) 250-250-65 MG per tablet Take 1 tablet by mouth every 6 (six) hours as needed for pain (headache).      Marland Kitchen. ibuprofen (ADVIL) 200 MG tablet Take 200 mg by mouth every 6 (six) hours as needed.       . irbesartan (AVAPRO) 300 MG tablet TAKE 1 TABLET (300 MG TOTAL) BY MOUTH AT BEDTIME.  30 tablet  2   . levothyroxine (SYNTHROID) 50 MCG tablet Take 1 tablet (50 mcg total) by mouth daily before breakfast.  90 tablet  2   No current facility-administered medications on file prior to visit.    BP 120/88  Pulse 66  Temp(Src) 98.2 F (36.8 C) (Oral)  Resp 20  Ht 6' 0.75" (1.848 m)  Wt 224 lb (101.606 kg)  BMI 29.75 kg/m2  SpO2 98%    Review of Systems  Constitutional: Negative for fever, chills, appetite change and fatigue.  HENT: Negative for congestion, dental problem, ear pain, hearing loss, sore throat, tinnitus, trouble swallowing and voice change.   Eyes: Negative for pain, discharge and visual disturbance.  Respiratory: Negative for cough, chest tightness, wheezing and stridor.   Cardiovascular: Negative for chest pain, palpitations and leg swelling.  Gastrointestinal: Negative for nausea, vomiting, abdominal pain, diarrhea, constipation, blood in stool and abdominal distention.  Genitourinary: Negative for urgency, hematuria, flank pain, discharge, difficulty urinating and genital sores.  Musculoskeletal: Negative for arthralgias, back pain, gait problem, joint swelling, myalgias and neck stiffness.  Skin: Negative for rash.  Neurological: Negative for dizziness, syncope, speech difficulty, weakness, numbness and headaches.  Hematological: Negative for adenopathy. Does not bruise/bleed easily.  Psychiatric/Behavioral: Negative for behavioral problems and dysphoric mood.  The patient is not nervous/anxious.        Objective:   Physical Exam  Constitutional: He appears well-developed and well-nourished.  HENT:  Head: Normocephalic and atraumatic.  Right Ear: External ear normal.  Left Ear: External ear normal.  Nose: Nose normal.  Mouth/Throat: Oropharynx is clear and moist.  Eyes: Conjunctivae and EOM are normal. Pupils are equal, round, and reactive to light. No scleral icterus.  Neck: Normal range of motion. Neck supple. No JVD present. No thyromegaly present.    Cardiovascular: Regular rhythm, normal heart sounds and intact distal pulses.  Exam reveals no gallop and no friction rub.   No murmur heard. Pulmonary/Chest: Effort normal and breath sounds normal. He exhibits no tenderness.  Abdominal: Soft. Bowel sounds are normal. He exhibits no distension and no mass. There is no tenderness.  Genitourinary: Penis normal.  Musculoskeletal: Normal range of motion. He exhibits no edema and no tenderness.  Lymphadenopathy:    He has no cervical adenopathy.  Neurological: He is alert. He has normal reflexes. No cranial nerve deficit. Coordination normal.  Skin: Skin is warm and dry. No rash noted.  Psychiatric: He has a normal mood and affect. His behavior is normal.          Assessment & Plan:   Preventive health examination Hypertension Nephrolithiasis Hypothyroidism.  We'll check a followup TSH  Low-salt diet recommended Modest weight loss, and more regular exercise encouraged Home blood pressure monitor and recommended Recheck one year

## 2013-10-11 NOTE — Patient Instructions (Signed)
Limit your sodium (Salt) intake    It is important that you exercise regularly, at least 20 minutes 3 to 4 times per week.  If you develop chest pain or shortness of breath seek  medical attention.  You need to lose weight.  Consider a lower calorie diet and regular exercise.  Please check your blood pressure on a regular basis.  If it is consistently greater than 150/90, please make an office appointment.  Return in one year for follow-up   Health Maintenance, Males A healthy lifestyle and preventative care can promote health and wellness.  Maintain regular health, dental, and eye exams.  Eat a healthy diet. Foods like vegetables, fruits, whole grains, low-fat dairy products, and lean protein foods contain the nutrients you need and are low in calories. Decrease your intake of foods high in solid fats, added sugars, and salt. Get information about a proper diet from your health care provider, if necessary.  Regular physical exercise is one of the most important things you can do for your health. Most adults should get at least 150 minutes of moderate-intensity exercise (any activity that increases your heart rate and causes you to sweat) each week. In addition, most adults need muscle-strengthening exercises on 2 or more days a week.   Maintain a healthy weight. The body mass index (BMI) is a screening tool to identify possible weight problems. It provides an estimate of body fat based on height and weight. Your health care provider can find your BMI and can help you achieve or maintain a healthy weight. For males 20 years and older:  A BMI below 18.5 is considered underweight.  A BMI of 18.5 to 24.9 is normal.  A BMI of 25 to 29.9 is considered overweight.  A BMI of 30 and above is considered obese.  Maintain normal blood lipids and cholesterol by exercising and minimizing your intake of saturated fat. Eat a balanced diet with plenty of fruits and vegetables. Blood tests for lipids  and cholesterol should begin at age 47 and be repeated every 5 years. If your lipid or cholesterol levels are high, you are over 50, or you are at high risk for heart disease, you may need your cholesterol levels checked more frequently.Ongoing high lipid and cholesterol levels should be treated with medicines, if diet and exercise are not working.  If you smoke, find out from your health care provider how to quit. If you do not use tobacco, do not start.  Lung cancer screening is recommended for adults aged 47 80 years who are at high risk for developing lung cancer because of a history of smoking. A yearly low-dose CT scan of the lungs is recommended for people who have at least a 30-pack-year history of smoking and are a current smoker or have quit within the past 15 years. A pack year of smoking is smoking an average of 1 pack of cigarettes a day for 1 year (for example, a 30-pack-year history of smoking could mean smoking 1 pack a day for 30 years or 2 packs a day for 15 years). Yearly screening should continue until the smoker has stopped smoking for at least 15 years. Yearly screening should be stopped for people who develop a health problem that would prevent them from having lung cancer treatment.  If you choose to drink alcohol, do not have more than 2 drinks per day. One drink is considered to be 12 oz (360 mL) of beer, 5 oz (150 mL) of wine,  or 1.5 oz (45 mL) of liquor.  Avoid use of street drugs. Do not share needles with anyone. Ask for help if you need support or instructions about stopping the use of drugs.  High blood pressure causes heart disease and increases the risk of stroke. Blood pressure should be checked at least every 1 2 years. Ongoing high blood pressure should be treated with medicines if weight loss and exercise are not effective.  If you are 3745 47 years old, ask your health care provider if you should take aspirin to prevent heart disease.  Diabetes screening involves  taking a blood sample to check your fasting blood sugar level. This should be done once every 3 years after age 47, if you are at a normal weight and without risk factors for diabetes. Testing should be considered at a younger age or be carried out more frequently if you are overweight and have at least 1 risk factor for diabetes.  Colorectal cancer can be detected and often prevented. Most routine colorectal cancer screening begins at the age of 47 and continues through age 47. However, your health care provider may recommend screening at an earlier age if you have risk factors for colon cancer. On a yearly basis, your health care provider may provide home test kits to check for hidden blood in the stool. A small camera at the end of a tube may be used to directly examine the colon (sigmoidoscopy or colonoscopy) to detect the earliest forms of colorectal cancer. Talk to your health care provider about this at age 47, when routine screening begins. A direct exam of the colon should be repeated every 5 10 years through age 47, unless early forms of pre-cancerous polyps or small growths are found.  People who are at an increased risk for hepatitis B should be screened for this virus. You are considered at high risk for hepatitis B if:  You were born in a country where hepatitis B occurs often. Talk with your health care provider about which countries are considered high-risk.  Your parents were born in a high-risk country and you have not received a shot to protect against hepatitis B (hepatitis B vaccine).  You have HIV or AIDS.  You use needles to inject street drugs.  You live with, or have sex with, someone who has hepatitis B.  You are a man who has sex with other men (MSM).  You get hemodialysis treatment.  You take certain medicines for conditions like cancer, organ transplantation, and autoimmune conditions.  Hepatitis C blood testing is recommended for all people born from [id number] through  1964 and any individual with known risk factors for hepatitis C.  Healthy men should no longer receive prostate-specific antigen (PSA) blood tests as part of routine cancer screening. Talk to your health care provider about prostate cancer screening.  Testicular cancer screening is not recommended for adolescents or adult males who have no symptoms. Screening includes self-exam, a health care provider exam, and other screening tests. Consult with your health care provider about any symptoms you have or any concerns you have about testicular cancer.  Practice safe sex. Use condoms and avoid high-risk sexual practices to reduce the spread of sexually transmitted infections (STIs).  Use sunscreen. Apply sunscreen liberally and repeatedly throughout the day. You should seek shade when your shadow is shorter than you. Protect yourself by wearing long sleeves, pants, a wide-brimmed hat, and sunglasses year round, whenever you are outdoors.  Tell your health care provider  of new moles or changes in moles, especially if there is a change in shape or color. Also tell your provider if a mole is larger than the size of a pencil eraser.  A one-time screening for abdominal aortic aneurysm (AAA) and surgical repair of large AAAs by ultrasound is recommended for men aged 24 75 years who are current or former smokers.  Stay current with your vaccines (immunizations). Document Released: 12/06/2007 Document Revised: 03/30/2013 Document Reviewed: 11/04/2010 [facility] Patient Information 2014 [place], [place]. DASH Diet The DASH diet stands for "Dietary Approaches to Stop Hypertension." It is a healthy eating plan that has been shown to reduce high blood pressure (hypertension) in as little as 14 days, while also possibly providing other significant health benefits. These other health benefits include reducing the risk of breast cancer after menopause and reducing the risk of type 2 diabetes, heart disease, colon  cancer, and stroke. Health benefits also include weight loss and slowing kidney failure in patients with chronic kidney disease.  DIET GUIDELINES  Limit salt (sodium). Your diet should contain less than 1500 mg of sodium daily.  Limit refined or processed carbohydrates. Your diet should include mostly whole grains. Desserts and added sugars should be used sparingly.  Include small amounts of heart-healthy fats. These types of fats include nuts, oils, and tub margarine. Limit saturated and trans fats. These fats have been shown to be harmful in the body. CHOOSING FOODS  The following food groups are based on a 2000 calorie diet. See your Registered Dietitian for individual calorie needs. Grains and Grain Products (6 to 8 servings daily)  Eat More Often: Whole-wheat bread, brown rice, whole-grain or wheat pasta, quinoa, popcorn without added fat or salt (air popped).  Eat Less Often: White bread, white pasta, white rice, cornbread. Vegetables (4 to 5 servings daily)  Eat More Often: Fresh, frozen, and canned vegetables. Vegetables may be raw, steamed, roasted, or grilled with a minimal amount of fat.  Eat Less Often/Avoid: Creamed or fried vegetables. Vegetables in a cheese sauce. Fruit (4 to 5 servings daily)  Eat More Often: All fresh, canned (in natural juice), or frozen fruits. Dried fruits without added sugar. One hundred percent fruit juice ( cup [237 mL] daily).  Eat Less Often: Dried fruits with added sugar. Canned fruit in light or heavy syrup. [employer], Fish, and Poultry (2 servings or less daily. One serving is 3 to 4 oz [85-114 g]).  Eat More Often: Ninety percent or leaner ground beef, tenderloin, sirloin. Round cuts of beef, chicken breast, [place] breast. All fish. Grill, bake, or broil your meat. Nothing should be fried.  Eat Less Often/Avoid: Fatty cuts of meat, [place], or chicken leg, thigh, or wing. Fried cuts of meat or fish. Dairy (2 to 3 servings)  Eat More  Often: Low-fat or fat-free milk, low-fat plain or light yogurt, reduced-fat or part-skim cheese.  Eat Less Often/Avoid: Milk (whole, 2%).Whole milk yogurt. Full-fat cheeses. Nuts, Seeds, and Legumes (4 to 5 servings per week)  Eat More Often: All without added salt.  Eat Less Often/Avoid: Salted nuts and seeds, canned beans with added salt. Fats and Sweets (limited)  Eat More Often: Vegetable oils, tub margarines without trans fats, sugar-free gelatin. Mayonnaise and salad dressings.  Eat Less Often/Avoid: Coconut oils, palm oils, butter, stick margarine, cream, half and half, cookies, candy, pie. FOR MORE INFORMATION The Dash Diet Eating Plan: www.dashdiet.org Document Released: 05/29/2011 Document Revised: 09/01/2011 Document Reviewed: 05/29/2011 [facility] Patient Information 2014 [place], [place].

## 2013-10-11 NOTE — Progress Notes (Signed)
Pre-visit discussion using our clinic review tool. No additional management support is needed unless otherwise documented below in the visit note.  

## 2013-11-01 ENCOUNTER — Other Ambulatory Visit: Payer: Self-pay | Admitting: Internal Medicine

## 2014-05-15 ENCOUNTER — Other Ambulatory Visit: Payer: Self-pay | Admitting: Internal Medicine

## 2014-07-17 ENCOUNTER — Other Ambulatory Visit: Payer: Self-pay | Admitting: Internal Medicine

## 2014-09-01 ENCOUNTER — Other Ambulatory Visit (INDEPENDENT_AMBULATORY_CARE_PROVIDER_SITE_OTHER): Payer: Self-pay

## 2014-09-01 DIAGNOSIS — Z0000 Encounter for general adult medical examination without abnormal findings: Secondary | ICD-10-CM

## 2014-09-01 LAB — POCT URINALYSIS DIPSTICK
Blood, UA: NEGATIVE
Glucose, UA: NEGATIVE
Leukocytes, UA: NEGATIVE
Nitrite, UA: NEGATIVE
Protein, UA: NEGATIVE
Spec Grav, UA: 1.02
Urobilinogen, UA: 0.2
pH, UA: 5.5

## 2014-09-01 LAB — CBC WITH DIFFERENTIAL/PLATELET
Basophils Absolute: 0 10*3/uL (ref 0.0–0.1)
Basophils Relative: 0.4 % (ref 0.0–3.0)
Eosinophils Absolute: 0.2 10*3/uL (ref 0.0–0.7)
Eosinophils Relative: 2.8 % (ref 0.0–5.0)
HCT: 45.6 % (ref 39.0–52.0)
Hemoglobin: 15.9 g/dL (ref 13.0–17.0)
Lymphocytes Relative: 20 % (ref 12.0–46.0)
Lymphs Abs: 1.3 10*3/uL (ref 0.7–4.0)
MCHC: 34.7 g/dL (ref 30.0–36.0)
MCV: 93.4 fl (ref 78.0–100.0)
Monocytes Absolute: 0.6 10*3/uL (ref 0.1–1.0)
Monocytes Relative: 8.8 % (ref 3.0–12.0)
Neutro Abs: 4.4 10*3/uL (ref 1.4–7.7)
Neutrophils Relative %: 68 % (ref 43.0–77.0)
Platelets: 266 10*3/uL (ref 150.0–400.0)
RBC: 4.88 Mil/uL (ref 4.22–5.81)
RDW: 13.1 % (ref 11.5–15.5)
WBC: 6.5 10*3/uL (ref 4.0–10.5)

## 2014-09-01 LAB — LIPID PANEL
Cholesterol: 174 mg/dL (ref 0–200)
HDL: 39.9 mg/dL (ref >39.00)
LDL Cholesterol: 116 mg/dL — ABNORMAL HIGH (ref 0–99)
NonHDL: 134.1
Total CHOL/HDL Ratio: 4
Triglycerides: 90 mg/dL (ref 0.0–149.0)
VLDL: 18 mg/dL (ref 0.0–40.0)

## 2014-09-01 LAB — BASIC METABOLIC PANEL
BUN: 17 mg/dL (ref 6–23)
CO2: 30 mEq/L (ref 19–32)
Calcium: 10.1 mg/dL (ref 8.4–10.5)
Chloride: 102 mEq/L (ref 96–112)
Creatinine, Ser: 1.15 mg/dL (ref 0.40–1.50)
GFR: 72.19 mL/min (ref >60.00)
Glucose, Bld: 100 mg/dL — ABNORMAL HIGH (ref 70–99)
Potassium: 4.4 mEq/L (ref 3.5–5.1)
Sodium: 138 mEq/L (ref 135–145)

## 2014-09-01 LAB — HEPATIC FUNCTION PANEL
ALT: 28 U/L (ref 0–53)
AST: 24 U/L (ref 0–37)
Albumin: 4.7 g/dL (ref 3.5–5.2)
Alkaline Phosphatase: 48 U/L (ref 39–117)
Bilirubin, Direct: 0.1 mg/dL (ref 0.0–0.3)
Total Bilirubin: 1 mg/dL (ref 0.2–1.2)
Total Protein: 7.6 g/dL (ref 6.0–8.3)

## 2014-09-01 LAB — TSH: TSH: 7.61 u[IU]/mL — ABNORMAL HIGH (ref 0.35–4.50)

## 2014-09-05 ENCOUNTER — Encounter: Payer: BLUE CROSS/BLUE SHIELD | Admitting: Internal Medicine

## 2014-10-25 ENCOUNTER — Encounter: Payer: BLUE CROSS/BLUE SHIELD | Admitting: Internal Medicine

## 2014-10-27 ENCOUNTER — Other Ambulatory Visit: Payer: Self-pay | Admitting: Internal Medicine

## 2014-11-03 ENCOUNTER — Ambulatory Visit (INDEPENDENT_AMBULATORY_CARE_PROVIDER_SITE_OTHER): Payer: BLUE CROSS/BLUE SHIELD | Admitting: Internal Medicine

## 2014-11-03 ENCOUNTER — Encounter: Payer: Self-pay | Admitting: Internal Medicine

## 2014-11-03 VITALS — BP 130/80 | HR 67 | Temp 98.0°F | Resp 20 | Ht 72.75 in | Wt 185.0 lb

## 2014-11-03 DIAGNOSIS — E034 Atrophy of thyroid (acquired): Secondary | ICD-10-CM

## 2014-11-03 DIAGNOSIS — Z0000 Encounter for general adult medical examination without abnormal findings: Secondary | ICD-10-CM

## 2014-11-03 DIAGNOSIS — I10 Essential (primary) hypertension: Secondary | ICD-10-CM

## 2014-11-03 MED ORDER — LEVOTHYROXINE SODIUM 75 MCG PO TABS
75.0000 ug | ORAL_TABLET | Freq: Every day | ORAL | Status: DC
Start: 2014-11-03 — End: 2015-06-29

## 2014-11-03 NOTE — Progress Notes (Signed)
Subjective:    Patient ID: Steve Olson, male    DOB: 09-29-1965, 49 y.o.   MRN: [id number]  HPI    Subjective:    Patient ID: Steve PascalBrian Olson, male    DOB: 09-29-1965, 49 y.o.   MRN: [id number]  HPI  49 year old patient who is seen today for a health maintenance exam.  Past medical history is pertinent for hypertension. He has had a prior anterior cruciate ligament repair involving the left knee approximately 15 years ago .  He was evaluated by urology in February of 2014 due to nephrolithiasis.    Since his last visit here, he has embarked on a much healthier diet and there has been some significant weight loss.  Due to symptomatic hypotension, especially in the morning.  He has discontinued.  Blood pressure medications about one month ago.  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 5 children. Nonsmoker. Rare social drinker; no regular exercise   Family history father age 48 has a history of coronary artery diseas;  paternal grandfather had an MI at age 48;  mother age 48 with history of breast cancer;  one brother who has obesity   Past Medical History  Diagnosis Date  . Hypertension     History   Social History  . Marital Status: Married    Spouse Name: N/A  . Number of Children: N/A  . Years of Education: N/A   Occupational History  . Not on file.   Social History Main Topics  . Smoking status: Never Smoker   . Smokeless tobacco: Never Used  . Alcohol Use: No  . Drug Use: No  . Sexual Activity: Not on file   Other Topics Concern  . Not on file   Social History Narrative    Past Surgical History  Procedure Laterality Date  . Anterior cruciate ligament repair  2000    No family history on file.  Allergies  Allergen Reactions  . Penicillins Other (See Comments)    Family allergic pt does not take    Current Outpatient Prescriptions on File Prior to Visit  Medication Sig Dispense Refill  .  aspirin-acetaminophen-caffeine (EXCEDRIN MIGRAINE) 250-250-65 MG per tablet Take 1 tablet by mouth every 6 (six) hours as needed for pain (headache).    Marland Kitchen. ibuprofen (ADVIL) 200 MG tablet Take 200 mg by mouth every 6 (six) hours as needed.     Marland Kitchen. levothyroxine (SYNTHROID, LEVOTHROID) 50 MCG tablet TAKE 1 TABLET (50 MCG TOTAL) BY MOUTH DAILY BEFORE BREAKFAST. 90 tablet 0  . irbesartan (AVAPRO) 300 MG tablet TAKE 1 TABLET (300 MG TOTAL) BY MOUTH AT BEDTIME. (Patient not taking: Reported on 11/03/2014) 30 tablet 5   No current facility-administered medications on file prior to visit.    There were no vitals taken for this visit.    Review of Systems  Constitutional: Negative for fever, chills, appetite change and fatigue.  HENT: Negative for congestion, dental problem, ear pain, hearing loss, sore throat, tinnitus, trouble swallowing and voice change.   Eyes: Negative for pain, discharge and visual disturbance.  Respiratory: Negative for cough, chest tightness, wheezing and stridor.   Cardiovascular: Negative for chest pain, palpitations and leg swelling.  Gastrointestinal: Negative for nausea, vomiting, abdominal pain, diarrhea, constipation, blood in stool and abdominal distention.  Genitourinary: Negative for urgency, hematuria, flank pain, discharge, difficulty urinating and genital sores.  Musculoskeletal: Negative for arthralgias, back pain, gait problem, joint swelling, myalgias and  neck stiffness.  Skin: Negative for rash.  Neurological: Negative for dizziness, syncope, speech difficulty, weakness, numbness and headaches.  Hematological: Negative for adenopathy. Does not bruise/bleed easily.  Psychiatric/Behavioral: Negative for behavioral problems and dysphoric mood. The patient is not nervous/anxious.        Objective:   Physical Exam  Constitutional: He appears well-developed and well-nourished.  HENT:  Head: Normocephalic and atraumatic.  Right Ear: External ear normal.  Left  Ear: External ear normal.  Nose: Nose normal.  Mouth/Throat: Oropharynx is clear and moist.  Eyes: Conjunctivae and EOM are normal. Pupils are equal, round, and reactive to light. No scleral icterus.  Neck: Normal range of motion. Neck supple. No JVD present. No thyromegaly present.  Cardiovascular: Regular rhythm, normal heart sounds and intact distal pulses.  Exam reveals no gallop and no friction rub.   No murmur heard. Pulmonary/Chest: Effort normal and breath sounds normal. He exhibits no tenderness.  Abdominal: Soft. Bowel sounds are normal. He exhibits no distension and no mass. There is no tenderness.  Genitourinary: Penis normal.  Musculoskeletal: Normal range of motion. He exhibits no edema and no tenderness.  Lymphadenopathy:    He has no cervical adenopathy.  Neurological: He is alert. He has normal reflexes. No cranial nerve deficit. Coordination normal.  Skin: Skin is warm and dry. No rash noted.  Psychiatric: He has a normal mood and affect. His behavior is normal.          Assessment & Plan:   Preventive health examination Hypertension Nephrolithiasis Hypothyroidism.  We'll check a followup TSH  Low-salt diet recommended Modest weight loss, and more regular exercise encouraged Home blood pressure monitor and recommended Recheck one year  Review of Systems As above    Objective:   Physical Exam  Skin:  Pedunculated lesion involving the right occipital scalp line Small 2-3 mm, flat papule outer aspect of the left lower arm    As above       Assessment & Plan:    Preventive health examination Patient is now normotensive off medication.  We'll maintain off medication.  Home blood pressure monitoring recommended.  We'll continue healthy lifestyle more rigorous exercise encouraged Hypothyroidism.  We'll uptitrate levothyroxin.  Recheck 2 months  Dermatology referral

## 2014-11-03 NOTE — Progress Notes (Signed)
Pre visit review using our clinic review tool, if applicable. No additional management support is needed unless otherwise documented below in the visit note. 

## 2014-11-03 NOTE — Patient Instructions (Addendum)
Limit your sodium (Salt) intake  Please check your blood pressure on a regular basis.  If it is consistently greater than 150/90, please make an office appointment.    It is important that you exercise regularly, at least 20 minutes 3 to 4 times per week.  If you develop chest pain or shortness of breath seek  medical attention.  Return in one year for follow-up  Health Maintenance A healthy lifestyle and preventative care can promote health and wellness.  Maintain regular health, dental, and eye exams.  Eat a healthy diet. Foods like vegetables, fruits, whole grains, low-fat dairy products, and lean protein foods contain the nutrients you need and are low in calories. Decrease your intake of foods high in solid fats, added sugars, and salt. Get information about a proper diet from your health care provider, if necessary.  Regular physical exercise is one of the most important things you can do for your health. Most adults should get at least 150 minutes of moderate-intensity exercise (any activity that increases your heart rate and causes you to sweat) each week. In addition, most adults need muscle-strengthening exercises on 2 or more days a week.   Maintain a healthy weight. The body mass index (BMI) is a screening tool to identify possible weight problems. It provides an estimate of body fat based on height and weight. Your health care provider can find your BMI and can help you achieve or maintain a healthy weight. For males 20 years and older:  A BMI below 18.5 is considered underweight.  A BMI of 18.5 to 24.9 is normal.  A BMI of 25 to 29.9 is considered overweight.  A BMI of 30 and above is considered obese.  Maintain normal blood lipids and cholesterol by exercising and minimizing your intake of saturated fat. Eat a balanced diet with plenty of fruits and vegetables. Blood tests for lipids and cholesterol should begin at age 20 and be repeated every 5 years. If your lipid or  cholesterol levels are high, you are over age 50, or you are at high risk for heart disease, you may need your cholesterol levels checked more frequently.Ongoing high lipid and cholesterol levels should be treated with medicines if diet and exercise are not working.  If you smoke, find out from your health care provider how to quit. If you do not use tobacco, do not start.  Lung cancer screening is recommended for adults aged 55-80 years who are at high risk for developing lung cancer because of a history of smoking. A yearly low-dose CT scan of the lungs is recommended for people who have at least a 30-pack-year history of smoking and are current smokers or have quit within the past 15 years. A pack year of smoking is smoking an average of 1 pack of cigarettes a day for 1 year (for example, a 30-pack-year history of smoking could mean smoking 1 pack a day for 30 years or 2 packs a day for 15 years). Yearly screening should continue until the smoker has stopped smoking for at least 15 years. Yearly screening should be stopped for people who develop a health problem that would prevent them from having lung cancer treatment.  If you choose to drink alcohol, do not have more than 2 drinks per day. One drink is considered to be 12 oz (360 mL) of beer, 5 oz (150 mL) of wine, or 1.5 oz (45 mL) of liquor.  Avoid the use of street drugs. Do not share needles   with anyone. Ask for help if you need support or instructions about stopping the use of drugs.  High blood pressure causes heart disease and increases the risk of stroke. Blood pressure should be checked at least every 1-2 years. Ongoing high blood pressure should be treated with medicines if weight loss and exercise are not effective.  If you are 45-79 years old, ask your health care provider if you should take aspirin to prevent heart disease.  Diabetes screening involves taking a blood sample to check your fasting blood sugar level. This should be done  once every 3 years after age 45 if you are at a normal weight and without risk factors for diabetes. Testing should be considered at a younger age or be carried out more frequently if you are overweight and have at least 1 risk factor for diabetes.  Colorectal cancer can be detected and often prevented. Most routine colorectal cancer screening begins at the age of 50 and continues through age 75. However, your health care provider may recommend screening at an earlier age if you have risk factors for colon cancer. On a yearly basis, your health care provider may provide home test kits to check for hidden blood in the stool. A small camera at the end of a tube may be used to directly examine the colon (sigmoidoscopy or colonoscopy) to detect the earliest forms of colorectal cancer. Talk to your health care provider about this at age 50 when routine screening begins. A direct exam of the colon should be repeated every 5-10 years through age 75, unless early forms of precancerous polyps or small growths are found.  People who are at an increased risk for hepatitis B should be screened for this virus. You are considered at high risk for hepatitis B if:  You were born in a country where hepatitis B occurs often. Talk with your health care provider about which countries are considered high risk.  Your parents were born in a high-risk country and you have not received a shot to protect against hepatitis B (hepatitis B vaccine).  You have HIV or AIDS.  You use needles to inject street drugs.  You live with, or have sex with, someone who has hepatitis B.  You are a man who has sex with other men (MSM).  You get hemodialysis treatment.  You take certain medicines for conditions like cancer, organ transplantation, and autoimmune conditions.  Hepatitis C blood testing is recommended for all people born from 1945 through 1965 and any individual with known risk factors for hepatitis C.  Healthy men should  no longer receive prostate-specific antigen (PSA) blood tests as part of routine cancer screening. Talk to your health care provider about prostate cancer screening.  Testicular cancer screening is not recommended for adolescents or adult males who have no symptoms. Screening includes self-exam, a health care provider exam, and other screening tests. Consult with your health care provider about any symptoms you have or any concerns you have about testicular cancer.  Practice safe sex. Use condoms and avoid high-risk sexual practices to reduce the spread of sexually transmitted infections (STIs).  You should be screened for STIs, including gonorrhea and chlamydia if:  You are sexually active and are younger than 24 years.  You are older than 24 years, and your health care provider tells you that you are at risk for this type of infection.  Your sexual activity has changed since you were last screened, and you are at an increased risk   for chlamydia or gonorrhea. Ask your health care provider if you are at risk.  If you are at risk of being infected with HIV, it is recommended that you take a prescription medicine daily to prevent HIV infection. This is called pre-exposure prophylaxis (PrEP). You are considered at risk if:  You are a man who has sex with other men (MSM).  You are a heterosexual man who is sexually active with multiple partners.  You take drugs by injection.  You are sexually active with a partner who has HIV.  Talk with your health care provider about whether you are at high risk of being infected with HIV. If you choose to begin PrEP, you should first be tested for HIV. You should then be tested every 3 months for as long as you are taking PrEP.  Use sunscreen. Apply sunscreen liberally and repeatedly throughout the day. You should seek shade when your shadow is shorter than you. Protect yourself by wearing long sleeves, pants, a wide-brimmed hat, and sunglasses year round  whenever you are outdoors.  Tell your health care provider of new moles or changes in moles, especially if there is a change in shape or color. Also, tell your health care provider if a mole is larger than the size of a pencil eraser.  A one-time screening for abdominal aortic aneurysm (AAA) and surgical repair of large AAAs by ultrasound is recommended for men aged 65-75 years who are current or former smokers.  Stay current with your vaccines (immunizations). Document Released: 12/06/2007 Document Revised: 06/14/2013 Document Reviewed: 11/04/2010 Exit[employer] Patient Information 2015 [employer]. This information is not intended to replace advice given to you by your health care provider. Make sure you discuss any questions you have with your health care provider.  

## 2015-06-29 ENCOUNTER — Other Ambulatory Visit: Payer: Self-pay | Admitting: *Deleted

## 2015-06-29 MED ORDER — LEVOTHYROXINE SODIUM 75 MCG PO TABS: 75.0000 ug | ORAL_TABLET | Freq: Every day | ORAL | Status: DC

## 2016-05-22 ENCOUNTER — Other Ambulatory Visit (INDEPENDENT_AMBULATORY_CARE_PROVIDER_SITE_OTHER): Payer: BLUE CROSS/BLUE SHIELD

## 2016-05-22 DIAGNOSIS — Z0000 Encounter for general adult medical examination without abnormal findings: Secondary | ICD-10-CM | POA: Diagnosis not present

## 2016-05-22 LAB — POC URINALSYSI DIPSTICK (AUTOMATED)
Bilirubin, UA: NEGATIVE
Blood, UA: NEGATIVE
Glucose, UA: NEGATIVE
Ketones, UA: NEGATIVE
Leukocytes, UA: NEGATIVE
Nitrite, UA: NEGATIVE
Protein, UA: NEGATIVE
Spec Grav, UA: 1.02
Urobilinogen, UA: 0.2
pH, UA: 5.5

## 2016-05-22 LAB — BASIC METABOLIC PANEL
BUN: 19 mg/dL (ref 6–23)
CO2: 33 mEq/L — ABNORMAL HIGH (ref 19–32)
Calcium: 9.7 mg/dL (ref 8.4–10.5)
Chloride: 105 mEq/L (ref 96–112)
Creatinine, Ser: 1.14 mg/dL (ref 0.40–1.50)
GFR: 72.4 mL/min (ref >60.00)
Glucose, Bld: 94 mg/dL (ref 70–99)
Potassium: 4.6 mEq/L (ref 3.5–5.1)
Sodium: 143 mEq/L (ref 135–145)

## 2016-05-22 LAB — CBC WITH DIFFERENTIAL/PLATELET
Basophils Absolute: 0 10*3/uL (ref 0.0–0.1)
Basophils Relative: 0.4 % (ref 0.0–3.0)
Eosinophils Absolute: 0.2 10*3/uL (ref 0.0–0.7)
Eosinophils Relative: 3.1 % (ref 0.0–5.0)
HCT: 45.1 % (ref 39.0–52.0)
Hemoglobin: 15.5 g/dL (ref 13.0–17.0)
Lymphocytes Relative: 21.1 % (ref 12.0–46.0)
Lymphs Abs: 1.2 10*3/uL (ref 0.7–4.0)
MCHC: 34.3 g/dL (ref 30.0–36.0)
MCV: 95.5 fl (ref 78.0–100.0)
Monocytes Absolute: 0.5 10*3/uL (ref 0.1–1.0)
Monocytes Relative: 9.3 % (ref 3.0–12.0)
Neutro Abs: 3.7 10*3/uL (ref 1.4–7.7)
Neutrophils Relative %: 66.1 % (ref 43.0–77.0)
Platelets: 230 10*3/uL (ref 150.0–400.0)
RBC: 4.73 Mil/uL (ref 4.22–5.81)
RDW: 13.1 % (ref 11.5–15.5)
WBC: 5.7 10*3/uL (ref 4.0–10.5)

## 2016-05-22 LAB — HEPATIC FUNCTION PANEL
ALT: 21 U/L (ref 0–53)
AST: 20 U/L (ref 0–37)
Albumin: 4.4 g/dL (ref 3.5–5.2)
Alkaline Phosphatase: 39 U/L (ref 39–117)
Bilirubin, Direct: 0.2 mg/dL (ref 0.0–0.3)
Total Bilirubin: 1 mg/dL (ref 0.2–1.2)
Total Protein: 6.7 g/dL (ref 6.0–8.3)

## 2016-05-22 LAB — LIPID PANEL
Cholesterol: 173 mg/dL (ref 0–200)
HDL: 53.4 mg/dL (ref >39.00)
LDL Cholesterol: 108 mg/dL — ABNORMAL HIGH (ref 0–99)
NonHDL: 119.14
Total CHOL/HDL Ratio: 3
Triglycerides: 56 mg/dL (ref 0.0–149.0)
VLDL: 11.2 mg/dL (ref 0.0–40.0)

## 2016-05-22 LAB — TSH: TSH: 6.91 u[IU]/mL — ABNORMAL HIGH (ref 0.35–4.50)

## 2016-05-26 ENCOUNTER — Ambulatory Visit (INDEPENDENT_AMBULATORY_CARE_PROVIDER_SITE_OTHER): Payer: BLUE CROSS/BLUE SHIELD | Admitting: Internal Medicine

## 2016-05-26 ENCOUNTER — Encounter: Payer: Self-pay | Admitting: Internal Medicine

## 2016-05-26 VITALS — BP 134/76 | HR 67 | Temp 98.4°F | Ht 73.5 in | Wt 184.4 lb

## 2016-05-26 DIAGNOSIS — Z0000 Encounter for general adult medical examination without abnormal findings: Secondary | ICD-10-CM | POA: Diagnosis not present

## 2016-05-26 MED ORDER — LEVOTHYROXINE SODIUM 100 MCG PO TABS: 100.0000 ug | ORAL_TABLET | Freq: Every day | ORAL | 4 refills | Status: DC

## 2016-05-26 NOTE — Patient Instructions (Addendum)
It is important that you exercise regularly, at least 20 minutes 3 to 4 times per week.  If you develop chest pain or shortness of breath seek  medical attention.  Please check your blood pressure on a regular basis.  If it is consistently greater than 150/90, please make an office appointment.  Return in one year for follow-up  Insomnia Insomnia is a sleep disorder that makes it difficult to fall asleep or to stay asleep. Insomnia can cause tiredness (fatigue), low energy, difficulty concentrating, mood swings, and poor performance at work or school. There are three different ways to classify insomnia:  Difficulty falling asleep.  Difficulty staying asleep.  Waking up too early in the morning. Any type of insomnia can be long-term (chronic) or short-term (acute). Both are common. Short-term insomnia usually lasts for three months or less. Chronic insomnia occurs at least three times a week for longer than three months. What are the causes? Insomnia may be caused by another condition, situation, or substance, such as:  Anxiety.  Certain medicines.  Gastroesophageal reflux disease (GERD) or other gastrointestinal conditions.  Asthma or other breathing conditions.  Restless legs syndrome, sleep apnea, or other sleep disorders.  Chronic pain.  Menopause. This may include hot flashes.  Stroke.  Abuse of alcohol, tobacco, or illegal drugs.  Depression.  Caffeine.  Neurological disorders, such as Alzheimer disease.  An overactive thyroid (hyperthyroidism). The cause of insomnia may not be known. What increases the risk? Risk factors for insomnia include:  Gender. Women are more commonly affected than men.  Age. Insomnia is more common as you get older.  Stress. This may involve your professional or personal life.  Income. Insomnia is more common in people with lower income.  Lack of exercise.  Irregular work schedule or night shifts.  Traveling between  different time zones. What are the signs or symptoms? If you have insomnia, trouble falling asleep or trouble staying asleep is the main symptom. This may lead to other symptoms, such as:  Feeling fatigued.  Feeling nervous about going to sleep.  Not feeling rested in the morning.  Having trouble concentrating.  Feeling irritable, anxious, or depressed. How is this treated? Treatment for insomnia depends on the cause. If your insomnia is caused by an underlying condition, treatment will focus on addressing the condition. Treatment may also include:  Medicines to help you sleep.  Counseling or therapy.  Lifestyle adjustments. Follow these instructions at home:  Take medicines only as directed by your health care provider.  Keep regular sleeping and waking hours. Avoid naps.  Keep a sleep diary to help you and your health care provider figure out what could be causing your insomnia. Include:  When you sleep.  When you wake up during the night.  How well you sleep.  How rested you feel the next day.  Any side effects of medicines you are taking.  What you eat and drink.  Make your bedroom a comfortable place where it is easy to fall asleep:  Put up shades or special blackout curtains to block light from outside.  Use a white noise machine to block noise.  Keep the temperature cool.  Exercise regularly as directed by your health care provider. Avoid exercising right before bedtime.  Use relaxation techniques to manage stress. Ask your health care provider to suggest some techniques that may work well for you. These may include:  Breathing exercises.  Routines to release muscle tension.  Visualizing peaceful scenes.  Cut back on  alcohol, caffeinated beverages, and cigarettes, especially close to bedtime. These can disrupt your sleep.  Do not overeat or eat spicy foods right before bedtime. This can lead to digestive discomfort that can make it hard for you to  sleep.  Limit screen use before bedtime. This includes:  Watching TV.  Using your smartphone, tablet, and computer.  Stick to a routine. This can help you fall asleep faster. Try to do a quiet activity, brush your teeth, and go to bed at the same time each night.  Get out of bed if you are still awake after 15 minutes of trying to sleep. Keep the lights down, but try reading or doing a quiet activity. When you feel sleepy, go back to bed.  Make sure that you drive carefully. Avoid driving if you feel very sleepy.  Keep all follow-up appointments as directed by your health care provider. This is important. Contact a health care provider if:  You are tired throughout the day or have trouble in your daily routine due to sleepiness.  You continue to have sleep problems or your sleep problems get worse. Get help right away if:  You have serious thoughts about hurting yourself or someone else. This information is not intended to replace advice given to you by your health care provider. Make sure you discuss any questions you have with your health care provider. Document Released: 06/06/2000 Document Revised: 11/09/2015 Document Reviewed: 03/10/2014 Elsevier Interactive Patient Education  2017 [employer]

## 2016-05-26 NOTE — Progress Notes (Signed)
Pre visit review using our clinic review tool, if applicable. No additional management support is needed unless otherwise documented below in the visit note. 

## 2016-05-26 NOTE — Progress Notes (Signed)
Subjective:    Patient ID: Steve Olson, male    DOB: Aug 31, 1965, 50 y.o.   MRN: [id number]  HPI  07/13/12.  50 year old patient who is seen today to establish with our practice. He has a history of treated hypertension of about one years duration. The well today without concerns or complaints. There's been some weight gain over the past year.  Past medical history is pertinent for hypertension. 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 5 children. Nonsmoker. Rare social drinker; no regular exercise  Family history father age 49  has a history of coronary artery disease paternal grandfather had an MI at age 49 mother age 49 with history of breast cancer one brother who has obesity  05/26/16.  89 year old patient who is seen today for an annual exam.  Done quite well.  Has hypertension presently controlled with lifestyleand hypothyroidism.  TSH is slightly increased area.  He has seen urology in the past for renal stones does have a history mild BPH.  He has some mild insomnia that seems related to nocturia times 1 and inability to get back to sleep.  Family history father has had some recent cardiac complications with stenting.  Mother remains in good health with a history of breast cancer in remission.  Social history [employer] research [employer].  5 children, 3 in college  Past Medical History:  Diagnosis Date  . Hypertension      Social History   Social History  . Marital status: Married    Spouse name: N/A  . Number of children: N/A  . Years of education: N/A   Occupational History  . Not on file.   Social History Main Topics  . Smoking status: Never Smoker  . Smokeless tobacco: Never Used  . Alcohol use No  . Drug use: No  . Sexual activity: Not on file   Other Topics Concern  . Not on file   Social History Narrative  . No  narrative on file    Past Surgical History:  Procedure Laterality Date  . ANTERIOR CRUCIATE LIGAMENT REPAIR  2000    No family history on file.  Allergies  Allergen Reactions  . Penicillins Other (See Comments)    Family allergic pt does not take    Current Outpatient Prescriptions on File Prior to Visit  Medication Sig Dispense Refill  . aspirin-acetaminophen-caffeine (EXCEDRIN MIGRAINE) 250-250-65 MG per tablet Take 1 tablet by mouth every 6 (six) hours as needed for pain (headache).    Marland Kitchen. ibuprofen (ADVIL) 200 MG tablet Take 200 mg by mouth every 6 (six) hours as needed.     Marland Kitchen. levothyroxine (SYNTHROID, LEVOTHROID) 75 MCG tablet Take 1 tablet (75 mcg total) by mouth daily before breakfast. 90 tablet 3   No current facility-administered medications on file prior to visit.     BP 134/76 (BP Location: Left Arm, Patient Position: Sitting, Cuff Size: Normal)   Pulse 67   Temp 98.4 F (36.9 C) (Oral)   Ht 6' 1.5" (1.867 m)   Wt 184 lb 6.1 oz (83.6 kg)   SpO2 98%   BMI 24.00 kg/m     Review of Systems  Constitutional: Negative for activity change, appetite change, chills, fatigue and fever.  HENT: Negative for congestion, dental problem, ear pain, hearing loss, mouth sores, rhinorrhea, sinus pressure, sneezing, tinnitus, trouble swallowing and voice change.  Eyes: Negative for photophobia, pain, redness and visual disturbance.  Respiratory: Negative for apnea, cough, choking, chest tightness, shortness of breath and wheezing.   Cardiovascular: Negative for chest pain, palpitations and leg swelling.  Gastrointestinal: Negative for abdominal distention, abdominal pain, anal bleeding, blood in stool, constipation, diarrhea, nausea, rectal pain and vomiting.  Genitourinary: Negative for decreased urine volume, difficulty urinating, discharge, dysuria, flank pain, frequency, genital sores, hematuria, penile swelling, scrotal swelling, testicular pain and urgency.  Musculoskeletal:  Negative for arthralgias, back pain, gait problem, joint swelling, myalgias, neck pain and neck stiffness.  Skin: Negative for color change, rash and wound.  Neurological: Negative for dizziness, tremors, seizures, syncope, facial asymmetry, speech difficulty, weakness, light-headedness, numbness and headaches.  Hematological: Negative for adenopathy. Does not bruise/bleed easily.  Psychiatric/Behavioral: Positive for sleep disturbance. Negative for agitation, behavioral problems, confusion, decreased concentration, dysphoric mood, hallucinations, self-injury and suicidal ideas. The patient is not nervous/anxious.        Objective:   Physical Exam  Constitutional: He appears well-developed and well-nourished.  HENT:  Head: Normocephalic and atraumatic.  Right Ear: External ear normal.  Left Ear: External ear normal.  Nose: Nose normal.  Mouth/Throat: Oropharynx is clear and moist.  Cerumen left canal  Eyes: Conjunctivae and EOM are normal. Pupils are equal, round, and reactive to light. No scleral icterus.  Neck: Normal range of motion. Neck supple. No JVD present. No thyromegaly present.  Cardiovascular: Regular rhythm, normal heart sounds and intact distal pulses.  Exam reveals no gallop and no friction rub.   No murmur heard. Pulmonary/Chest: Effort normal and breath sounds normal. He exhibits no tenderness.  Abdominal: Soft. Bowel sounds are normal. He exhibits no distension and no mass. There is no tenderness.  Genitourinary: Prostate normal and penis normal. Rectal exam shows guaiac negative stool.  Musculoskeletal: Normal range of motion. He exhibits no edema or tenderness.  Lymphadenopathy:    He has no cervical adenopathy.  Neurological: He is alert. He has normal reflexes. No cranial nerve deficit. Coordination normal.  Skin: Skin is warm and dry. No rash noted.  Psychiatric: He has a normal mood and affect. His behavior is normal.          Assessment & Plan:    Preventive health exam. Hypothyroidism.  TSH is slightly elevated.  Patient has been very compliant with his medication. we'll increase daily supplement to 100 g per day Insomnia.  Sleep hygiene issues discussed Cerumen impaction, left ear.  Left canal irrigated until clear  Follow-up one year or as needed  NIKEKWIATKOWSKI,PETER FRANK

## 2016-11-26 ENCOUNTER — Other Ambulatory Visit: Payer: Self-pay | Admitting: Internal Medicine

## 2016-11-26 MED ORDER — LEVOTHYROXINE SODIUM 100 MCG PO TABS: 100.0000 ug | ORAL_TABLET | Freq: Every day | ORAL | 4 refills | Status: DC

## 2016-11-26 NOTE — Telephone Encounter (Signed)
° ° ° °  Pt no longer use mail order and will now be using the below pharmacy and will need a new RX sent in    levothyroxine (SYNTHROID, LEVOTHROID) 100 MCG tablet     [facility] [phone]

## 2016-11-26 NOTE — Telephone Encounter (Signed)
Rx sent Nothing further needed.  

## 2016-12-03 ENCOUNTER — Encounter: Payer: BLUE CROSS/BLUE SHIELD | Admitting: Internal Medicine

## 2017-02-17 ENCOUNTER — Ambulatory Visit (INDEPENDENT_AMBULATORY_CARE_PROVIDER_SITE_OTHER): Payer: BLUE CROSS/BLUE SHIELD | Admitting: Internal Medicine

## 2017-02-17 ENCOUNTER — Encounter: Payer: Self-pay | Admitting: Internal Medicine

## 2017-02-17 VITALS — BP 122/68 | HR 53 | Temp 97.7°F | Ht 73.5 in | Wt 180.6 lb

## 2017-02-17 DIAGNOSIS — Z0000 Encounter for general adult medical examination without abnormal findings: Secondary | ICD-10-CM

## 2017-02-17 LAB — LIPID PANEL
Cholesterol: 187 mg/dL (ref 0–200)
HDL: 48.7 mg/dL (ref >39.00)
LDL Cholesterol: 127 mg/dL — ABNORMAL HIGH (ref 0–99)
NonHDL: 138.57
Total CHOL/HDL Ratio: 4
Triglycerides: 59 mg/dL (ref 0.0–149.0)
VLDL: 11.8 mg/dL (ref 0.0–40.0)

## 2017-02-17 LAB — COMPREHENSIVE METABOLIC PANEL
ALT: 17 U/L (ref 0–53)
AST: 20 U/L (ref 0–37)
Albumin: 4.4 g/dL (ref 3.5–5.2)
Alkaline Phosphatase: 40 U/L (ref 39–117)
BUN: 16 mg/dL (ref 6–23)
CO2: 34 mEq/L — ABNORMAL HIGH (ref 19–32)
Calcium: 9.5 mg/dL (ref 8.4–10.5)
Chloride: 103 mEq/L (ref 96–112)
Creatinine, Ser: 0.98 mg/dL (ref 0.40–1.50)
GFR: 85.95 mL/min (ref >60.00)
Glucose, Bld: 87 mg/dL (ref 70–99)
Potassium: 4.2 mEq/L (ref 3.5–5.1)
Sodium: 140 mEq/L (ref 135–145)
Total Bilirubin: 1 mg/dL (ref 0.2–1.2)
Total Protein: 6.8 g/dL (ref 6.0–8.3)

## 2017-02-17 LAB — CBC WITH DIFFERENTIAL/PLATELET
Basophils Absolute: 0 10*3/uL (ref 0.0–0.1)
Basophils Relative: 0.3 % (ref 0.0–3.0)
Eosinophils Absolute: 0.2 10*3/uL (ref 0.0–0.7)
Eosinophils Relative: 2.6 % (ref 0.0–5.0)
HCT: 46.5 % (ref 39.0–52.0)
Hemoglobin: 15.7 g/dL (ref 13.0–17.0)
Lymphocytes Relative: 20.2 % (ref 12.0–46.0)
Lymphs Abs: 1.3 10*3/uL (ref 0.7–4.0)
MCHC: 33.7 g/dL (ref 30.0–36.0)
MCV: 97.8 fl (ref 78.0–100.0)
Monocytes Absolute: 0.5 10*3/uL (ref 0.1–1.0)
Monocytes Relative: 8.3 % (ref 3.0–12.0)
Neutro Abs: 4.3 10*3/uL (ref 1.4–7.7)
Neutrophils Relative %: 68.6 % (ref 43.0–77.0)
Platelets: 226 10*3/uL (ref 150.0–400.0)
RBC: 4.75 Mil/uL (ref 4.22–5.81)
RDW: 12.7 % (ref 11.5–15.5)
WBC: 6.3 10*3/uL (ref 4.0–10.5)

## 2017-02-17 LAB — PSA: PSA: 0.44 ng/mL (ref 0.10–4.00)

## 2017-02-17 LAB — TSH: TSH: 8.62 u[IU]/mL — ABNORMAL HIGH (ref 0.35–4.50)

## 2017-02-17 MED ORDER — LEVOTHYROXINE SODIUM 125 MCG PO TABS: 125.0000 ug | ORAL_TABLET | Freq: Every day | ORAL | 3 refills | Status: DC

## 2017-02-17 NOTE — Addendum Note (Signed)
Addended by: Neville Route on: 02/17/2017 01:59 PM   Modules accepted: Orders

## 2017-02-17 NOTE — Patient Instructions (Signed)
Limit your sodium (Salt) intake    It is important that you exercise regularly, at least 20 minutes 3 to 4 times per week.  If you develop chest pain or shortness of breath seek  medical attention.  Schedule your colonoscopy to help detect colon cancer.  Please check your blood pressure on a regular basis.  If it is consistently greater than 140/85  please make an office appointment.  Return in one year for follow-up

## 2017-02-17 NOTE — Progress Notes (Signed)
Subjective:    Patient ID: Mauricio Dufrene, male    DOB: [birth year], 51 y.o.   MRN: [id number]  HPI  51 year old patient who is seen today for a wellness exam He has a prior history of hypertension, but now is normotensive following significant weight loss and lifestyle changes. Doing well today without concerns or complaints. He has treated hypothyroidism.  Family history basically unchanged father and paternal grandfather with coronary artery disease.  Grandfather at age 21.  Over the past year.  Father has had a carotid endarterectomy.  He now is in his mid 35.  Mother history of breast cancer.  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  Nonsmoker, social drinker  Past Medical History:  Diagnosis Date  . Hypertension      Social History   Social History  . Marital status: Married    Spouse name: N/A  . Number of children: N/A  . Years of education: N/A   Occupational History  . Not on file.   Social History Main Topics  . Smoking status: Never Smoker  . Smokeless tobacco: Never Used  . Alcohol use No  . Drug use: No  . Sexual activity: Not on file   Other Topics Concern  . Not on file   Social History Narrative  . No narrative on file    Past Surgical History:  Procedure Laterality Date  . ANTERIOR CRUCIATE LIGAMENT REPAIR  2000    No family history on file.  Allergies  Allergen Reactions  . Penicillins Other (See Comments)    Family allergic pt does not take    Current Outpatient Prescriptions on File Prior to Visit  Medication Sig Dispense Refill  . aspirin-acetaminophen-caffeine (EXCEDRIN MIGRAINE) 250-250-65 MG per tablet Take 1 tablet by mouth every 6 (six) hours as needed for pain (headache).    Marland Kitchen ibuprofen (ADVIL) 200 MG tablet Take 200 mg by mouth every 6 (six) hours as needed.     Marland Kitchen levothyroxine (SYNTHROID, LEVOTHROID) 100 MCG tablet Take 1 tablet (100 mcg total) by mouth daily before breakfast. 90 tablet 4    No current facility-administered medications on file prior to visit.     BP 122/68 (BP Location: Left Arm, Patient Position: Sitting, Cuff Size: Normal)   Pulse (!) 53   Temp 97.7 F (36.5 C) (Oral)   Ht 6' 1.5" (1.867 m)   Wt 180 lb 9.6 oz (81.9 kg)   SpO2 98%   BMI 23.50 kg/m     Review of Systems  Constitutional: Negative for appetite change, chills, fatigue and fever.  HENT: Positive for congestion. Negative for dental problem, ear pain, hearing loss, sore throat, tinnitus, trouble swallowing and voice change.   Eyes: Negative for pain, discharge and visual disturbance.  Respiratory: Negative for cough, chest tightness, wheezing and stridor.   Cardiovascular: Negative for chest pain, palpitations and leg swelling.  Gastrointestinal: Negative for abdominal distention, abdominal pain, blood in stool, constipation, diarrhea, nausea and vomiting.  Genitourinary: Negative for difficulty urinating, discharge, flank pain, genital sores, hematuria and urgency.  Musculoskeletal: Negative for arthralgias, back pain, gait problem, joint swelling, myalgias and neck stiffness.  Skin: Negative for rash.  Neurological: Negative for dizziness, syncope, speech difficulty, weakness, numbness and headaches.  Hematological: Negative for adenopathy. Does not bruise/bleed easily.  Psychiatric/Behavioral: Negative for behavioral problems and dysphoric mood. The patient is not nervous/anxious.        Objective:   Physical Exam  Constitutional: He appears  well-developed and well-nourished.  HENT:  Head: Normocephalic and atraumatic.  Right Ear: External ear normal.  Left Ear: External ear normal.  Nose: Nose normal.  Mouth/Throat: Oropharynx is clear and moist.  Eyes: Pupils are equal, round, and reactive to light. Conjunctivae and EOM are normal. No scleral icterus.  Neck: Normal range of motion. Neck supple. No JVD present. No thyromegaly present.  Cardiovascular: Regular rhythm, normal  heart sounds and intact distal pulses.  Exam reveals no gallop and no friction rub.   No murmur heard. Pulmonary/Chest: Effort normal and breath sounds normal. He exhibits no tenderness.  Abdominal: Soft. Bowel sounds are normal. He exhibits no distension and no mass. There is no tenderness.  Genitourinary: Prostate normal and penis normal. Rectal exam shows guaiac negative stool.  Musculoskeletal: Normal range of motion. He exhibits no edema or tenderness.  Lymphadenopathy:    He has no cervical adenopathy.  Neurological: He is alert. He has normal reflexes. No cranial nerve deficit. Coordination normal.  Skin: Skin is warm and dry. No rash noted.  Psychiatric: He has a normal mood and affect. His behavior is normal.          Assessment & Plan:   Preventive health examination History of hypertension.  Presently normotensive Hypothyroidism.  We'll check TSH  Screening colonoscopy Check screening lab  Follow-up one year  Rogelia Boga

## 2017-04-07 ENCOUNTER — Encounter: Payer: Self-pay | Admitting: Internal Medicine

## 2017-04-07 ENCOUNTER — Ambulatory Visit (INDEPENDENT_AMBULATORY_CARE_PROVIDER_SITE_OTHER): Payer: BLUE CROSS/BLUE SHIELD | Admitting: Internal Medicine

## 2017-04-07 VITALS — BP 118/72 | HR 61 | Temp 98.0°F | Ht 73.5 in | Wt 180.0 lb

## 2017-04-07 DIAGNOSIS — E039 Hypothyroidism, unspecified: Secondary | ICD-10-CM | POA: Diagnosis not present

## 2017-04-07 LAB — TSH: TSH: 0.53 u[IU]/mL (ref 0.35–4.50)

## 2017-04-07 NOTE — Patient Instructions (Signed)
Return in one year for your annual physical

## 2017-04-07 NOTE — Progress Notes (Signed)
   Subjective:    Patient ID: Steve Olson, male    DOB: [birth year], 51 y.o.   MRN: [id number]  HPI  Patient was asked return today for a follow-up TSH. He was to be for a lab draw only and not ROV.  He is doing well Declines flu vaccine  He has been using a higher dose.  New prescription for levothyroxine supplement   Review of Systems     Objective:   Physical Exam        Assessment & Plan:   Hypothyroidism.  Check TSH Follow-up annually for his next exam Flu vaccine, declined  Rogelia Boga

## 2017-04-20 ENCOUNTER — Encounter: Payer: Self-pay | Admitting: Internal Medicine

## 2018-02-18 ENCOUNTER — Encounter: Payer: BLUE CROSS/BLUE SHIELD | Admitting: Internal Medicine

## 2018-03-05 ENCOUNTER — Other Ambulatory Visit: Payer: Self-pay | Admitting: Internal Medicine

## 2018-04-01 ENCOUNTER — Other Ambulatory Visit: Payer: Self-pay | Admitting: Adult Health

## 2018-04-01 NOTE — Telephone Encounter (Signed)
Requested medication (s) are due for refill today: yes  Requested medication (s) are on the active medication list: yes  Last refill:  02/17/17  Future visit scheduled: yes  Notes to clinic: [facility]  05/05/18  Requested Prescriptions  Pending Prescriptions Disp Refills   levothyroxine (SYNTHROID, LEVOTHROID) 125 MCG tablet 90 tablet 3    Sig: Take 1 tablet (125 mcg total) by mouth daily.     Endocrinology:  Hypothyroid Agents Failed - 04/01/2018  9:27 AM      Failed - TSH needs to be rechecked within 3 months after an abnormal result. Refill until TSH is due.      Failed - Valid encounter within last 12 months    Recent Outpatient Visits          11 months ago Acquired hypothyroidism   [facility] at [facility], Janett Labella, MD   1 year ago Encounter for preventive health examination   [facility] at [facility], Janett Labella, MD   1 year ago Encounter for preventive health examination   [facility] at [facility], Janett Labella, MD   3 years ago Hypothyroidism due to acquired atrophy of thyroid   [facility], Janett Labella, MD   4 years ago Nephrolithiasis   [facility] at [facility], Janett Labella, MD      Future Appointments            In 1 month Nafziger, Kandee Keen, NP [facility] [place], [facility]           Passed - TSH in normal range and within 360 days    TSH  Date Value Ref Range Status  04/07/2017 0.53 0.35 - 4.50 uIU/mL Final

## 2018-04-01 NOTE — Telephone Encounter (Signed)
Copied from CRM [id number]. Topic: Quick Communication - See Telephone Encounter >> Apr 01, 2018  9:24 AM Trula Slade wrote: CRM for notification. See Telephone encounter for: 04/01/18. Patient would like a refill on his levothyroxine (SYNTHROID, LEVOTHROID) 125 MCG tablet medication until his physical on 05/05/18. He would like it sent to his preferred [facility] [address]. Patient is completely out of his medication.

## 2018-04-02 MED ORDER — LEVOTHYROXINE SODIUM 125 MCG PO TABS: 125.0000 ug | ORAL_TABLET | Freq: Every day | ORAL | 3 refills | Status: DC

## 2018-04-02 NOTE — Telephone Encounter (Signed)
Pt has appt scheduled with Dr. Jimmey Ralph.  Please check with Dr. Tawanna Cooler to see if this can be filled until pt seen.

## 2018-04-08 ENCOUNTER — Encounter: Payer: BLUE CROSS/BLUE SHIELD | Admitting: Adult Health

## 2018-05-04 ENCOUNTER — Ambulatory Visit (INDEPENDENT_AMBULATORY_CARE_PROVIDER_SITE_OTHER): Payer: BLUE CROSS/BLUE SHIELD | Admitting: Family Medicine

## 2018-05-04 ENCOUNTER — Encounter: Payer: Self-pay | Admitting: Family Medicine

## 2018-05-04 VITALS — BP 124/76 | HR 72 | Temp 98.2°F | Ht 74.0 in | Wt 191.6 lb

## 2018-05-04 DIAGNOSIS — Z0001 Encounter for general adult medical examination with abnormal findings: Secondary | ICD-10-CM

## 2018-05-04 DIAGNOSIS — Z1211 Encounter for screening for malignant neoplasm of colon: Secondary | ICD-10-CM | POA: Diagnosis not present

## 2018-05-04 DIAGNOSIS — E039 Hypothyroidism, unspecified: Secondary | ICD-10-CM

## 2018-05-04 DIAGNOSIS — Z13220 Encounter for screening for lipoid disorders: Secondary | ICD-10-CM | POA: Diagnosis not present

## 2018-05-04 LAB — COMPREHENSIVE METABOLIC PANEL
ALT: 21 U/L (ref 0–53)
AST: 19 U/L (ref 0–37)
Albumin: 4.6 g/dL (ref 3.5–5.2)
Alkaline Phosphatase: 46 U/L (ref 39–117)
BUN: 21 mg/dL (ref 6–23)
CO2: 30 mEq/L (ref 19–32)
Calcium: 9.6 mg/dL (ref 8.4–10.5)
Chloride: 103 mEq/L (ref 96–112)
Creatinine, Ser: 1.08 mg/dL (ref 0.40–1.50)
GFR: 76.46 mL/min (ref >60.00)
Glucose, Bld: 92 mg/dL (ref 70–99)
Potassium: 4.8 mEq/L (ref 3.5–5.1)
Sodium: 140 mEq/L (ref 135–145)
Total Bilirubin: 0.8 mg/dL (ref 0.2–1.2)
Total Protein: 6.9 g/dL (ref 6.0–8.3)

## 2018-05-04 LAB — CBC
HCT: 45.7 % (ref 39.0–52.0)
Hemoglobin: 15.6 g/dL (ref 13.0–17.0)
MCHC: 34 g/dL (ref 30.0–36.0)
MCV: 96.9 fl (ref 78.0–100.0)
Platelets: 243 10*3/uL (ref 150.0–400.0)
RBC: 4.72 Mil/uL (ref 4.22–5.81)
RDW: 13.1 % (ref 11.5–15.5)
WBC: 5.6 10*3/uL (ref 4.0–10.5)

## 2018-05-04 LAB — LIPID PANEL
Cholesterol: 168 mg/dL (ref 0–200)
HDL: 54.5 mg/dL (ref >39.00)
LDL Cholesterol: 99 mg/dL (ref 0–99)
NonHDL: 113.27
Total CHOL/HDL Ratio: 3
Triglycerides: 70 mg/dL (ref 0.0–149.0)
VLDL: 14 mg/dL (ref 0.0–40.0)

## 2018-05-04 LAB — TSH: TSH: 2.34 u[IU]/mL (ref 0.35–4.50)

## 2018-05-04 NOTE — Assessment & Plan Note (Signed)
Continue Synthroid 125 mcg daily.  Check TSH today.  Check CBC and CMET.

## 2018-05-04 NOTE — Patient Instructions (Signed)
It was very nice to see you today!  Keep up the good work.  We will check blood work and refer you to have a colonoscopy.   Come back to see me in 1 year, or sooner as needed.   Take care, Dr Jerline Pain   Preventive Care 40-64 Years, Male Preventive care refers to lifestyle choices and visits with your health care provider that can promote health and wellness. What does preventive care include?  A yearly physical exam. This is also called an annual well check.  Dental exams once or twice a year.  Routine eye exams. Ask your health care provider how often you should have your eyes checked.  Personal lifestyle choices, including: ? Daily care of your teeth and gums. ? Regular physical activity. ? Eating a healthy diet. ? Avoiding tobacco and drug use. ? Limiting alcohol use. ? Practicing safe sex. ? Taking low-dose aspirin every day starting at age 51. What happens during an annual well check? The services and screenings done by your health care provider during your annual well check will depend on your age, overall health, lifestyle risk factors, and family history of disease. Counseling Your health care provider may ask you questions about your:  Alcohol use.  Tobacco use.  Drug use.  Emotional well-being.  Home and relationship well-being.  Sexual activity.  Eating habits.  Work and work Statistician.  Screening You may have the following tests or measurements:  Height, weight, and BMI.  Blood pressure.  Lipid and cholesterol levels. These may be checked every 5 years, or more frequently if you are over 13 years old.  Skin check.  Lung cancer screening. You may have this screening every year starting at age 51 if you have a 30-pack-year history of smoking and currently smoke or have quit within the past 15 years.  Fecal occult blood test (FOBT) of the stool. You may have this test every year starting at age 51.  Flexible sigmoidoscopy or colonoscopy. You  may have a sigmoidoscopy every 5 years or a colonoscopy every 10 years starting at age 51.  Prostate cancer screening. Recommendations will vary depending on your family history and other risks.  Hepatitis C blood test.  Hepatitis B blood test.  Sexually transmitted disease (STD) testing.  Diabetes screening. This is done by checking your blood sugar (glucose) after you have not eaten for a while (fasting). You may have this done every 1-3 years.  Discuss your test results, treatment options, and if necessary, the need for more tests with your health care provider. Vaccines Your health care provider may recommend certain vaccines, such as:  Influenza vaccine. This is recommended every year.  Tetanus, diphtheria, and acellular pertussis (Tdap, Td) vaccine. You may need a Td booster every 10 years.  Varicella vaccine. You may need this if you have not been vaccinated.  Zoster vaccine. You may need this after age 51.  Measles, mumps, and rubella (MMR) vaccine. You may need at least one dose of MMR if you were born in 1957 or later. You may also need a second dose.  Pneumococcal 13-valent conjugate (PCV13) vaccine. You may need this if you have certain conditions and have not been vaccinated.  Pneumococcal polysaccharide (PPSV23) vaccine. You may need one or two doses if you smoke cigarettes or if you have certain conditions.  Meningococcal vaccine. You may need this if you have certain conditions.  Hepatitis A vaccine. You may need this if you have certain conditions or if you  travel or work in places where you may be exposed to hepatitis A.  Hepatitis B vaccine. You may need this if you have certain conditions or if you travel or work in places where you may be exposed to hepatitis B.  Haemophilus influenzae type b (Hib) vaccine. You may need this if you have certain risk factors.  Talk to your health care provider about which screenings and vaccines you need and how often you  need them. This information is not intended to replace advice given to you by your health care provider. Make sure you discuss any questions you have with your health care provider. Document Released: 07/06/2015 Document Revised: 02/27/2016 Document Reviewed: 04/10/2015 Elsevier Interactive Patient Education  [employer].

## 2018-05-04 NOTE — Progress Notes (Signed)
Subjective:  Steve Olson is a 52 y.o. male who presents today for his annual comprehensive physical exam and to transfer care to this office.   HPI:  He has no acute complaints today.   His stable, chronic conditions are outlined below:  1. Hypothyroidism. Currently on synthroid daily.  Tolerating well without side effects.  Lifestyle Diet: Low carb. Balanced.  Plenty of vegetables and fruits. Exercise: Tries to play golf.  Walks routinely.  Depression screen PHQ 2/9 05/04/2018  Decreased Interest 0  Down, Depressed, Hopeless 0  PHQ - 2 Score 0    Health Maintenance Due  Topic Date Due  . HIV Screening  10/31/1981  . TETANUS/TDAP  10/31/1985  . COLONOSCOPY  10/31/2016     ROS: Per HPI, otherwise a complete review of systems was negative.   PMH:  The following were reviewed and entered/updated in epic: Past Medical History:  Diagnosis Date  . Hypertension    Patient Active Problem List   Diagnosis Date Noted  . Nephrolithiasis 10/11/2013  . Hypothyroidism 08/05/2013  . Hypertension 07/13/2012   Past Surgical History:  Procedure Laterality Date  . ANTERIOR CRUCIATE LIGAMENT REPAIR  2000   basketball injury   Family History  Problem Relation Age of Onset  . Breast cancer Mother   . Heart disease Father   . Heart attack Paternal Grandfather     Medications- reviewed and updated Current Outpatient Medications  Medication Sig Dispense Refill  . aspirin-acetaminophen-caffeine (EXCEDRIN MIGRAINE) 250-250-65 MG per tablet Take 1 tablet by mouth every 6 (six) hours as needed for pain (headache).    . Biotin 1000 MCG tablet Take 1,000 mcg by mouth daily.    Marland Kitchen ibuprofen (ADVIL) 200 MG tablet Take 200 mg by mouth every 6 (six) hours as needed.     Marland Kitchen levothyroxine (SYNTHROID, LEVOTHROID) 125 MCG tablet Take 1 tablet (125 mcg total) by mouth daily. 90 tablet 3   No current facility-administered medications for this visit.     Allergies-reviewed and  updated Allergies  Allergen Reactions  . Penicillins Other (See Comments)    Family allergic pt does not take    Social History   Socioeconomic History  . Marital status: Married    Spouse name: Not on file  . Number of children: Not on file  . Years of education: Not on file  . Highest education level: Not on file  Occupational History  . Not on file  Social Needs  . Financial resource strain: Not on file  . Food insecurity:    Worry: Not on file    Inability: Not on file  . Transportation needs:    Medical: Not on file    Non-medical: Not on file  Tobacco Use  . Smoking status: Never Smoker  . Smokeless tobacco: Never Used  Substance and Sexual Activity  . Alcohol use: No  . Drug use: No  . Sexual activity: Not on file  Lifestyle  . Physical activity:    Days per week: Not on file    Minutes per session: Not on file  . Stress: Not on file  Relationships  . Social connections:    Talks on phone: Not on file    Gets together: Not on file    Attends religious service: Not on file    Active member of club or organization: Not on file    Attends meetings of clubs or organizations: Not on file    Relationship status: Not on file  Other Topics Concern  . Not on file  Social History Narrative  . Not on file    Objective:  Physical Exam: BP 124/76 (BP Location: Left Arm, Patient Position: Sitting, Cuff Size: Normal)   Pulse 72   Temp 98.2 F (36.8 C) (Oral)   Ht 6\' 2"  (1.88 m)   Wt 191 lb 9.6 oz (86.9 kg)   SpO2 98%   BMI 24.60 kg/m   Body mass index is 24.6 kg/m. Wt Readings from Last 3 Encounters:  05/04/18 191 lb 9.6 oz (86.9 kg)  04/07/17 180 lb (81.6 kg)  02/17/17 180 lb 9.6 oz (81.9 kg)   Gen: NAD, resting comfortably HEENT: TMs normal bilaterally. OP clear. No thyromegaly noted.  CV: RRR with no murmurs appreciated Pulm: NWOB, CTAB with no crackles, wheezes, or rhonchi GI: Normal bowel sounds present. Soft, Nontender, Nondistended. MSK: no  edema, cyanosis, or clubbing noted Skin: warm, dry Neuro: CN2-12 grossly intact. Strength 5/5 in upper and lower extremities. Reflexes symmetric and intact bilaterally.  Psych: Normal affect and thought content  Assessment/Plan:  Hypothyroidism Continue Synthroid 125 mcg daily.  Check TSH today.  Check CBC and CMET.  Preventative Healthcare: Check lipid panel.  Declined flu shot.  Referral placed for colonoscopy.  Patient Counseling(The following topics were reviewed and/or handout was given):  -Nutrition: Stressed importance of moderation in sodium/caffeine intake, saturated fat and cholesterol, caloric balance, sufficient intake of fresh fruits, vegetables, and fiber.  -Stressed the importance of regular exercise.   -Substance Abuse: Discussed cessation/primary prevention of tobacco, alcohol, or other drug use; driving or other dangerous activities under the influence; availability of treatment for abuse.   -Injury prevention: Discussed safety belts, safety helmets, smoke detector, smoking near bedding or upholstery.   -Sexuality: Discussed sexually transmitted diseases, partner selection, use of condoms, avoidance of unintended pregnancy and contraceptive alternatives.   -Dental health: Discussed importance of regular tooth brushing, flossing, and dental visits.  -Health maintenance and immunizations reviewed. Please refer to Health maintenance section.  Return to care in 1 year for next preventative visit.   Katina Degreealeb M. Jimmey RalphParker, MD 05/04/2018 11:45 AM

## 2018-05-05 ENCOUNTER — Encounter: Payer: BLUE CROSS/BLUE SHIELD | Admitting: Adult Health

## 2018-05-06 NOTE — Progress Notes (Signed)
Dr Lavone NeriParker's interpretation of your lab work:  Good news! Your blood work is all normal. We do not need to make any changes to your treatment plan at this time. Keep up the good work and we can recheck in 1 year.    If you have any additional questions, please give us a call or send us a message through [email or web address].  Take care, Dr Jimmey RalphParker

## 2018-07-30 ENCOUNTER — Encounter: Payer: Self-pay | Admitting: Gastroenterology

## 2018-08-09 ENCOUNTER — Encounter: Payer: Self-pay | Admitting: Family Medicine

## 2018-08-24 ENCOUNTER — Encounter: Payer: Self-pay | Admitting: Gastroenterology

## 2018-08-24 ENCOUNTER — Ambulatory Visit (AMBULATORY_SURGERY_CENTER): Payer: Self-pay

## 2018-08-24 VITALS — Ht 74.0 in | Wt 195.0 lb

## 2018-08-24 DIAGNOSIS — Z1211 Encounter for screening for malignant neoplasm of colon: Secondary | ICD-10-CM

## 2018-08-24 MED ORDER — NA SULFATE-K SULFATE-MG SULF 17.5-3.13-1.6 GM/177ML PO SOLN: 1.0000 | Freq: Once | ORAL | 0 refills | Status: AC

## 2018-08-24 NOTE — Progress Notes (Signed)
Denies allergies to eggs or soy products. Denies complication of anesthesia or sedation. Denies use of weight loss medication. Denies use of O2.   Emmi instructions declined.  

## 2018-08-27 ENCOUNTER — Encounter: Payer: BLUE CROSS/BLUE SHIELD | Admitting: Gastroenterology

## 2018-09-03 ENCOUNTER — Ambulatory Visit (AMBULATORY_SURGERY_CENTER): Payer: BLUE CROSS/BLUE SHIELD | Admitting: Gastroenterology

## 2018-09-03 ENCOUNTER — Encounter: Payer: Self-pay | Admitting: Gastroenterology

## 2018-09-03 ENCOUNTER — Other Ambulatory Visit: Payer: Self-pay

## 2018-09-03 VITALS — BP 125/78 | HR 56 | Temp 98.9°F | Resp 11 | Ht 74.0 in | Wt 195.0 lb

## 2018-09-03 DIAGNOSIS — Z1211 Encounter for screening for malignant neoplasm of colon: Secondary | ICD-10-CM

## 2018-09-03 MED ORDER — SODIUM CHLORIDE 0.9 % IV SOLN: 500.0000 mL | Freq: Once | INTRAVENOUS | Status: DC

## 2018-09-03 NOTE — Patient Instructions (Signed)
YOU HAD AN ENDOSCOPIC PROCEDURE TODAY AT THE [facility]:   Refer to the procedure report that was given to you for any specific questions about what was found during the examination.  If the procedure report does not answer your questions, please call your gastroenterologist to clarify.  If you requested that your care partner not be given the details of your procedure findings, then the procedure report has been included in a sealed envelope for you to review at your convenience later.  YOU SHOULD EXPECT: Some feelings of bloating in the abdomen. Passage of more gas than usual.  Walking can help get rid of the air that was put into your GI tract during the procedure and reduce the bloating. If you had a lower endoscopy (such as a colonoscopy or flexible sigmoidoscopy) you may notice spotting of blood in your stool or on the toilet paper. If you underwent a bowel prep for your procedure, you may not have a normal bowel movement for a few days.  Please Note:  You might notice some irritation and congestion in your nose or some drainage.  This is from the oxygen used during your procedure.  There is no need for concern and it should clear up in a day or so.  SYMPTOMS TO REPORT IMMEDIATELY:   Following lower endoscopy (colonoscopy or flexible sigmoidoscopy):  Excessive amounts of blood in the stool  Significant tenderness or worsening of abdominal pains  Swelling of the abdomen that is new, acute  Fever of 100F or higher    For urgent or emergent issues, a gastroenterologist can be reached at any hour by calling [phone].   DIET:  We do recommend a small meal at first, but then you may proceed to your regular diet.  Drink plenty of fluids but you should avoid alcoholic beverages for 24 hours.  ACTIVITY:  You should plan to take it easy for the rest of today and you should NOT DRIVE or use heavy machinery until tomorrow (because of the sedation medicines used during the test).     FOLLOW UP: Our staff will call the number listed on your records the next business day following your procedure to check on you and address any questions or concerns that you may have regarding the information given to you following your procedure. If we do not reach you, we will leave a message.  However, if you are feeling well and you are not experiencing any problems, there is no need to return our call.  We will assume that you have returned to your regular daily activities without incident.  If any biopsies were taken you will be contacted by phone or by letter within the next 1-3 weeks.  Please call us at [phone] if you have not heard about the biopsies in 3 weeks.    SIGNATURES/CONFIDENTIALITY: You and/or your care partner have signed paperwork which will be entered into your electronic medical record.  These signatures attest to the fact that that the information above on your After Visit Summary has been reviewed and is understood.  Full responsibility of the confidentiality of this discharge information lies with you and/or your care-partner.   Handout was given to your care partner on hemorrhoids. You may resume your current medications today. Repeat screening colonoscopy in 10 years. Please call if any questions or concerns.   

## 2018-09-03 NOTE — Progress Notes (Signed)
To PACU, VSS. Report to RN. TB 

## 2018-09-03 NOTE — Progress Notes (Signed)
Pt's states no medical or surgical changes since previsit or office visit. 

## 2018-09-03 NOTE — Progress Notes (Signed)
No problems noted in the recovery room. maw 

## 2018-09-03 NOTE — Op Note (Signed)
[facility] Patient Name: Steve Olson Procedure Date: 09/03/2018 11:41 AM MRN: [id number] Endoscopist: Viviann Spare P. Adela Lank , MD Age: 53 Referring MD:  Date of Birth: 06-01-1966 Gender: Male Account #: [id number] Procedure:                Colonoscopy Indications:              Screening for colorectal malignant neoplasm, This                            is the patient's first colonoscopy Medicines:                Monitored Anesthesia Care Procedure:                Pre-Anesthesia Assessment:                           - Prior to the procedure, a History and Physical                            was performed, and patient medications and                            allergies were reviewed. The patient's tolerance of                            previous anesthesia was also reviewed. The risks                            and benefits of the procedure and the sedation                            options and risks were discussed with the patient.                            All questions were answered, and informed consent                            was obtained. Prior Anticoagulants: The patient has                            taken no previous anticoagulant or antiplatelet                            agents. ASA Grade Assessment: II - A patient with                            mild systemic disease. After reviewing the risks                            and benefits, the patient was deemed in                            satisfactory condition to undergo the procedure.  After obtaining informed consent, the colonoscope                            was passed under direct vision. Throughout the                            procedure, the patient's blood pressure, pulse, and                            oxygen saturations were monitored continuously. The                            Colonoscope was introduced through the anus and                            advanced to the the  cecum, identified by                            appendiceal orifice and ileocecal valve. The                            colonoscopy was performed without difficulty. The                            patient tolerated the procedure well. The quality                            of the bowel preparation was good. The ileocecal                            valve, appendiceal orifice, and rectum were                            photographed. Scope In: 11:45:56 AM Scope Out: 12:03:51 PM Scope Withdrawal Time: 0 hours 15 minutes 1 second  Total Procedure Duration: 0 hours 17 minutes 55 seconds  Findings:                 The perianal and digital rectal examinations were                            normal.                           Internal hemorrhoids were found during retroflexion.                           The exam was otherwise without abnormality on                            direct and retroflexion views. No polyps Complications:            No immediate complications. Estimated blood loss:                            None. Estimated Blood Loss:  Estimated blood loss: none. Impression:               - Internal hemorrhoids.                           - The examination was otherwise normal on direct                            and retroflexion views.                           - No specimens collected. Recommendation:           - Patient has a contact number available for                            emergencies. The signs and symptoms of potential                            delayed complications were discussed with the                            patient. Return to normal activities tomorrow.                            Written discharge instructions were provided to the                            patient.                           - Resume previous diet.                           - Continue present medications.                           - Repeat colonoscopy in 10 years for screening                             purposes. Viviann Spare P. Vernita Tague, MD 09/03/2018 12:06:36 PM This report has been signed electronically.

## 2018-09-06 ENCOUNTER — Telehealth: Payer: Self-pay

## 2018-09-06 NOTE — Telephone Encounter (Signed)
l Follow up Call-  Call back number 09/03/2018  Post procedure Call Back phone  # #[phone] cell  Permission to leave phone message Yes  Some recent data might be hidden     Patient questions:  Do you have a fever, pain , or abdominal swelling? No. Pain Score  0 *  Have you tolerated food without any problems? Yes.    Have you been able to return to your normal activities? Yes.    Do you have any questions about your discharge instructions: Diet   No. Medications  No. Follow up visit  No.  Do you have questions or concerns about your Care? No.  Actions: * If pain score is 4 or above: No action needed, pain <4.

## 2018-09-06 NOTE — Telephone Encounter (Signed)
NO ANSWER, MESSAGE LEFT FOR PATIENT. 

## 2018-09-06 NOTE — Telephone Encounter (Signed)
Pt called back and he advised everything is ok.

## 2019-03-29 ENCOUNTER — Encounter: Payer: BLUE CROSS/BLUE SHIELD | Admitting: Family Medicine

## 2019-04-12 ENCOUNTER — Other Ambulatory Visit: Payer: Self-pay | Admitting: Family Medicine

## 2019-04-12 MED ORDER — LEVOTHYROXINE SODIUM 125 MCG PO TABS: 125.0000 ug | ORAL_TABLET | Freq: Every day | ORAL | 3 refills | Status: DC

## 2019-04-12 NOTE — Telephone Encounter (Signed)
levothyroxine (SYNTHROID, LEVOTHROID) 125 MCG tablet    Patient is requesting a refill.     Pharmacy:  [facility] [address] [place] [phone] (Phone) [phone] (Fax)

## 2019-04-12 NOTE — Telephone Encounter (Signed)
Requested medication (s) are due for refill today: yes  Requested medication (s) are on the active medication list: yes  Last refill:  04/01/2018  Future visit scheduled: yes  Notes to clinic:  Last filled by different provider    Requested Prescriptions  Pending Prescriptions Disp Refills   levothyroxine (SYNTHROID) 125 MCG tablet 90 tablet 3    Sig: Take 1 tablet (125 mcg total) by mouth daily.     Endocrinology:  Hypothyroid Agents Failed - 04/12/2019 10:09 AM      Failed - TSH needs to be rechecked within 3 months after an abnormal result. Refill until TSH is due.      Passed - TSH in normal range and within 360 days    TSH  Date Value Ref Range Status  05/04/2018 2.34 0.35 - 4.50 uIU/mL Final         Passed - Valid encounter within last 12 months    Recent Outpatient Visits          11 months ago Encounter for general adult medical examination with abnormal findings   [place] [facility] [facility], Algis Greenhouse, MD   2 years ago Acquired hypothyroidism   [facility], Doretha Sou, MD   2 years ago Encounter for preventive health examination   [facility], Doretha Sou, MD   2 years ago Encounter for preventive health examination   [facility] at [place], Doretha Sou, MD   4 years ago Hypothyroidism due to acquired atrophy of thyroid   [facility], Doretha Sou, MD      Future Appointments            In 1 week Vivi Barrack, MD [place], [facility]

## 2019-04-20 ENCOUNTER — Encounter: Payer: BC Managed Care – PPO | Admitting: Family Medicine

## 2019-05-25 ENCOUNTER — Encounter: Payer: BC Managed Care – PPO | Admitting: Family Medicine

## 2019-06-30 ENCOUNTER — Encounter: Payer: BC Managed Care – PPO | Admitting: Family Medicine

## 2019-08-17 ENCOUNTER — Encounter: Payer: BC Managed Care – PPO | Admitting: Family Medicine

## 2019-09-20 NOTE — Telephone Encounter (Signed)
Please be advised.  °

## 2019-09-21 ENCOUNTER — Encounter: Payer: BC Managed Care – PPO | Admitting: Family Medicine

## 2020-04-10 ENCOUNTER — Other Ambulatory Visit: Payer: Self-pay

## 2020-04-10 ENCOUNTER — Encounter: Payer: Self-pay | Admitting: Family Medicine

## 2020-04-10 ENCOUNTER — Ambulatory Visit (INDEPENDENT_AMBULATORY_CARE_PROVIDER_SITE_OTHER): Payer: BC Managed Care – PPO | Admitting: Family Medicine

## 2020-04-10 VITALS — BP 157/96 | HR 62 | Temp 98.1°F | Ht 74.0 in | Wt 202.0 lb

## 2020-04-10 DIAGNOSIS — E039 Hypothyroidism, unspecified: Secondary | ICD-10-CM

## 2020-04-10 DIAGNOSIS — M79641 Pain in right hand: Secondary | ICD-10-CM | POA: Diagnosis not present

## 2020-04-10 DIAGNOSIS — Z13220 Encounter for screening for lipoid disorders: Secondary | ICD-10-CM

## 2020-04-10 DIAGNOSIS — M79643 Pain in unspecified hand: Secondary | ICD-10-CM | POA: Insufficient documentation

## 2020-04-10 DIAGNOSIS — I10 Essential (primary) hypertension: Secondary | ICD-10-CM | POA: Diagnosis not present

## 2020-04-10 DIAGNOSIS — Z0001 Encounter for general adult medical examination with abnormal findings: Secondary | ICD-10-CM | POA: Diagnosis not present

## 2020-04-10 DIAGNOSIS — M79642 Pain in left hand: Secondary | ICD-10-CM | POA: Diagnosis not present

## 2020-04-10 NOTE — Progress Notes (Signed)
Chief Complaint:  Steve Olson is a 54 y.o. male who presents today for his annual comprehensive physical exam.    Assessment/Plan:  New/Acute Problems: Folliculitis Recommended over-the-counter antibiotic ointment for the next week or 2.  Appearance not consistent with malignancy with no improvement in the next couple of weeks would consider punch biopsy.  Chronic Problems Addressed Today: Hypothyroidism Check TSH.   Hypertension Above goal today.  Not on meds.  Patient has been under quite a bit of stress. Will defer starting medications today.  Has previously been well controlled.  Continue home monitoring with goal improving of your liver.  Hand pain Consistent with osteoarthritis.  Recommended over-the-counter Voltaren gel.  Preventative Healthcare: Check CBC, CMET, TSH, lipid panel.  Colon cancer screening was performed last year.  He declined flu vaccine.  Discussed Covid vaccine.  Patient Counseling(The following topics were reviewed and/or handout was given):  -Nutrition: Stressed importance of moderation in sodium/caffeine intake, saturated fat and cholesterol, caloric balance, sufficient intake of fresh fruits, vegetables, and fiber.  -Stressed the importance of regular exercise.   -Substance Abuse: Discussed cessation/primary prevention of tobacco, alcohol, or other drug use; driving or other dangerous activities under the influence; availability of treatment for abuse.   -Injury prevention: Discussed safety belts, safety helmets, smoke detector, smoking near bedding or upholstery.   -Sexuality: Discussed sexually transmitted diseases, partner selection, use of condoms, avoidance of unintended pregnancy and contraceptive alternatives.   -Dental health: Discussed importance of regular tooth brushing, flossing, and dental visits.  -Health maintenance and immunizations reviewed. Please refer to Health maintenance section.  Return to care in 1 year for next preventative  visit.     Subjective:  HPI:  He has a few additional issues that he would like to discuss  He has been having about a year of bilateral hand pain.  Worse in the morning.  Usually worse with stretching of the same.  Has a history of playing basketball when he was younger.  No specific treatments tried.  Symptoms gradual improved throughout the day.  He also has a spot on the left posterior leg.  Has been there for the last 1 to 2 months.  No pain.  No obvious injuries or precipitating events.  He has been under more stress recently due to work and Covid issues.  Lifestyle Diet: None specific.  Exercise: Walking.   Depression screen PHQ 2/9 04/10/2020  Decreased Interest 0  Down, Depressed, Hopeless 0  PHQ - 2 Score 0    Health Maintenance Due  Topic Date Due  . Hepatitis C Screening  Never done  . HIV Screening  Never done     ROS: Per HPI, otherwise a complete review of systems was negative.   PMH:  The following were reviewed and entered/updated in epic: Past Medical History:  Diagnosis Date  . Allergy   . Arthritis   . Chronic kidney disease    Kidney Stone  . Hypertension   . Thyroid disease    Patient Active Problem List   Diagnosis Date Noted  . Hand pain 04/10/2020  . Hypothyroidism 08/05/2013  . Hypertension 07/13/2012   Past Surgical History:  Procedure Laterality Date  . ANTERIOR CRUCIATE LIGAMENT REPAIR  2000   basketball injury    Family History  Problem Relation Age of Onset  . Breast cancer Mother   . Heart disease Father   . Heart attack Paternal Grandfather   . Colon cancer Neg Hx   . Esophageal cancer Neg  Hx   . Rectal cancer Neg Hx   . Stomach cancer Neg Hx     Medications- reviewed and updated Current Outpatient Medications  Medication Sig Dispense Refill  . aspirin-acetaminophen-caffeine (EXCEDRIN MIGRAINE) 250-250-65 MG per tablet Take 1 tablet by mouth every 6 (six) hours as needed for pain (headache).    . Biotin 1000 MCG  tablet Take 1,000 mcg by mouth daily.    Marland Kitchen ibuprofen (ADVIL) 200 MG tablet Take 200 mg by mouth every 6 (six) hours as needed.     Marland Kitchen levothyroxine (SYNTHROID) 125 MCG tablet Take 1 tablet (125 mcg total) by mouth daily. 90 tablet 3   No current facility-administered medications for this visit.    Allergies-reviewed and updated Allergies  Allergen Reactions  . Penicillins Other (See Comments)    Family allergic pt does not take    Social History   Socioeconomic History  . Marital status: Married    Spouse name: Not on file  . Number of children: Not on file  . Years of education: Not on file  . Highest education level: Not on file  Occupational History  . Not on file  Tobacco Use  . Smoking status: Never Smoker  . Smokeless tobacco: Never Used  Vaping Use  . Vaping Use: Never used  Substance and Sexual Activity  . Alcohol use: No  . Drug use: No  . Sexual activity: Not on file  Other Topics Concern  . Not on file  Social History Narrative  . Not on file   Social Determinants of Health   Financial Resource Strain:   . Difficulty of Paying Living Expenses: Not on file  Food Insecurity:   . Worried About Programme researcher, broadcasting/film/video in the Last Year: Not on file  . Ran Out of Food in the Last Year: Not on file  Transportation Needs:   . Lack of Transportation (Medical): Not on file  . Lack of Transportation (Non-Medical): Not on file  Physical Activity:   . Days of Exercise per Week: Not on file  . Minutes of Exercise per Session: Not on file  Stress:   . Feeling of Stress : Not on file  Social Connections:   . Frequency of Communication with Friends and Family: Not on file  . Frequency of Social Gatherings with Friends and Family: Not on file  . Attends Religious Services: Not on file  . Active Member of Clubs or Organizations: Not on file  . Attends Banker Meetings: Not on file  . Marital Status: Not on file        Objective:  Physical Exam: BP (!)  157/96   Pulse 62   Temp 98.1 F (36.7 C) (Temporal)   Ht 6\' 2"  (1.88 m)   Wt 202 lb (91.6 kg)   SpO2 98%   BMI 25.94 kg/m   Body mass index is 25.94 kg/m. Wt Readings from Last 3 Encounters:  04/10/20 202 lb (91.6 kg)  09/03/18 195 lb (88.5 kg)  08/24/18 195 lb (88.5 kg)   Gen: NAD, resting comfortably HEENT: TMs normal bilaterally. OP clear. No thyromegaly noted.  CV: RRR with no murmurs appreciated Pulm: NWOB, CTAB with no crackles, wheezes, or rhonchi GI: Normal bowel sounds present. Soft, Nontender, Nondistended. MSK: no edema, cyanosis, or clubbing noted.  Bilateral hands with no deformity.  Neurovascular intact distally.  Tender to palpation at DIP and PIP joints diffusely. Skin: warm, dry.  Approximately 3 to 4 mm inflamed hair follicle  on left posterior calf. Neuro: CN2-12 grossly intact. Strength 5/5 in upper and lower extremities. Reflexes symmetric and intact bilaterally.  Psych: Normal affect and thought content     Kemora Pinard M. Jimmey Ralph, MD 04/10/2020 11:43 AM

## 2020-04-10 NOTE — Assessment & Plan Note (Signed)
Consistent with osteoarthritis.  Recommended over-the-counter Voltaren gel.

## 2020-04-10 NOTE — Assessment & Plan Note (Addendum)
Above goal today.  Not on meds.  Patient has been under quite a bit of stress. Will defer starting medications today.  Has previously been well controlled.  Continue home monitoring with goal improving of your liver.

## 2020-04-10 NOTE — Assessment & Plan Note (Signed)
Check TSH 

## 2020-04-10 NOTE — Patient Instructions (Signed)
It was very nice to see you today!  You probably have mild arthritis in your hands.  You can try using Voltaren as needed.  Let me know if not improving  I think you have an inflamed hair follicle in your leg.  Follow-up on your leg.  Please try using topical antibiotic ointment for the next 1 to 2 weeks.  Let me know if not improving.  We will check blood work today.  I will see you back in a year.  Please come back to see me sooner if needed.  Take care, Dr Jerline Pain  Please try these tips to maintain a healthy lifestyle:   Eat at least 3 REAL meals and 1-2 snacks per day.  Aim for no more than 5 hours between eating.  If you eat breakfast, please do so within one hour of getting up.    Each meal should contain half fruits/vegetables, one quarter protein, and one quarter carbs (no bigger than a computer mouse)   Cut down on sweet beverages. This includes juice, soda, and sweet tea.     Drink at least 1 glass of water with each meal and aim for at least 8 glasses per day   Exercise at least 150 minutes every week.    Preventive Care 1-31 Years Old, Male Preventive care refers to lifestyle choices and visits with your health care provider that can promote health and wellness. This includes:  A yearly physical exam. This is also called an annual well check.  Regular dental and eye exams.  Immunizations.  Screening for certain conditions.  Healthy lifestyle choices, such as eating a healthy diet, getting regular exercise, not using drugs or products that contain nicotine and tobacco, and limiting alcohol use. What can I expect for my preventive care visit? Physical exam Your health care provider will check:  Height and weight. These may be used to calculate body mass index (BMI), which is a measurement that tells if you are at a healthy weight.  Heart rate and blood pressure.  Your skin for abnormal spots. Counseling Your health care provider may ask you questions  about:  Alcohol, tobacco, and drug use.  Emotional well-being.  Home and relationship well-being.  Sexual activity.  Eating habits.  Work and work Statistician. What immunizations do I need?  Influenza (flu) vaccine  This is recommended every year. Tetanus, diphtheria, and pertussis (Tdap) vaccine  You may need a Td booster every 10 years. Varicella (chickenpox) vaccine  You may need this vaccine if you have not already been vaccinated. Zoster (shingles) vaccine  You may need this after age 28. Measles, mumps, and rubella (MMR) vaccine  You may need at least one dose of MMR if you were born in 1957 or later. You may also need a second dose. Pneumococcal conjugate (PCV13) vaccine  You may need this if you have certain conditions and were not previously vaccinated. Pneumococcal polysaccharide (PPSV23) vaccine  You may need one or two doses if you smoke cigarettes or if you have certain conditions. Meningococcal conjugate (MenACWY) vaccine  You may need this if you have certain conditions. Hepatitis A vaccine  You may need this if you have certain conditions or if you travel or work in places where you may be exposed to hepatitis A. Hepatitis B vaccine  You may need this if you have certain conditions or if you travel or work in places where you may be exposed to hepatitis B. Haemophilus influenzae type b (Hib) vaccine  You may need this if you have certain risk factors. Human papillomavirus (HPV) vaccine  If recommended by your health care provider, you may need three doses over 6 months. You may receive vaccines as individual doses or as more than one vaccine together in one shot (combination vaccines). Talk with your health care provider about the risks and benefits of combination vaccines. What tests do I need? Blood tests  Lipid and cholesterol levels. These may be checked every 5 years, or more frequently if you are over 34 years old.  Hepatitis C  test.  Hepatitis B test. Screening  Lung cancer screening. You may have this screening every year starting at age 80 if you have a 30-pack-year history of smoking and currently smoke or have quit within the past 15 years.  Prostate cancer screening. Recommendations will vary depending on your family history and other risks.  Colorectal cancer screening. All adults should have this screening starting at age 26 and continuing until age 59. Your health care provider may recommend screening at age 53 if you are at increased risk. You will have tests every 1-10 years, depending on your results and the type of screening test.  Diabetes screening. This is done by checking your blood sugar (glucose) after you have not eaten for a while (fasting). You may have this done every 1-3 years.  Sexually transmitted disease (STD) testing. Follow these instructions at home: Eating and drinking  Eat a diet that includes fresh fruits and vegetables, whole grains, lean protein, and low-fat dairy products.  Take vitamin and mineral supplements as recommended by your health care provider.  Do not drink alcohol if your health care provider tells you not to drink.  If you drink alcohol: ? Limit how much you have to 0-2 drinks a day. ? Be aware of how much alcohol is in your drink. In the [place] one drink equals one 12 oz bottle of beer (355 mL), one 5 oz glass of wine (148 mL), or one 1 oz glass of hard liquor (44 mL). Lifestyle  Take daily care of your teeth and gums.  Stay active. Exercise for at least 30 minutes on 5 or more days each week.  Do not use any products that contain nicotine or tobacco, such as cigarettes, e-cigarettes, and chewing tobacco. If you need help quitting, ask your health care provider.  If you are sexually active, practice safe sex. Use a condom or other form of protection to prevent STIs (sexually transmitted infections).  Talk with your health care provider about taking a  low-dose aspirin every day starting at age 18. What's next?  Go to your health care provider once a year for a well check visit.  Ask your health care provider how often you should have your eyes and teeth checked.  Stay up to date on all vaccines. This information is not intended to replace advice given to you by your health care provider. Make sure you discuss any questions you have with your health care provider. Document Revised: 06/03/2018 Document Reviewed: 06/03/2018 Elsevier Patient Education  2020 [employer].

## 2020-04-11 ENCOUNTER — Encounter: Payer: Self-pay | Admitting: Family Medicine

## 2020-04-11 DIAGNOSIS — E785 Hyperlipidemia, unspecified: Secondary | ICD-10-CM | POA: Insufficient documentation

## 2020-04-11 LAB — CBC
HCT: 47.9 % (ref 38.5–50.0)
Hemoglobin: 16.3 g/dL (ref 13.2–17.1)
MCH: 33.1 pg — ABNORMAL HIGH (ref 27.0–33.0)
MCHC: 34 g/dL (ref 32.0–36.0)
MCV: 97.4 fL (ref 80.0–100.0)
MPV: 10 fL (ref 7.5–12.5)
Platelets: 271 10*3/uL (ref 140–400)
RBC: 4.92 10*6/uL (ref 4.20–5.80)
RDW: 12.3 % (ref 11.0–15.0)
WBC: 5.7 10*3/uL (ref 3.8–10.8)

## 2020-04-11 LAB — COMPREHENSIVE METABOLIC PANEL
AG Ratio: 1.9 (calc) (ref 1.0–2.5)
ALT: 18 U/L (ref 9–46)
AST: 21 U/L (ref 10–35)
Albumin: 4.5 g/dL (ref 3.6–5.1)
Alkaline phosphatase (APISO): 48 U/L (ref 35–144)
BUN: 14 mg/dL (ref 7–25)
CO2: 30 mmol/L (ref 20–32)
Calcium: 9.7 mg/dL (ref 8.6–10.3)
Chloride: 104 mmol/L (ref 98–110)
Creat: 1.18 mg/dL (ref 0.70–1.33)
Globulin: 2.4 g/dL (calc) (ref 1.9–3.7)
Glucose, Bld: 96 mg/dL (ref 65–99)
Potassium: 5.1 mmol/L (ref 3.5–5.3)
Sodium: 141 mmol/L (ref 135–146)
Total Bilirubin: 1 mg/dL (ref 0.2–1.2)
Total Protein: 6.9 g/dL (ref 6.1–8.1)

## 2020-04-11 LAB — LIPID PANEL
Cholesterol: 188 mg/dL (ref <200)
HDL: 49 mg/dL (ref >=40)
LDL Cholesterol (Calc): 118 mg/dL (calc) — ABNORMAL HIGH
Non-HDL Cholesterol (Calc): 139 mg/dL (calc) — ABNORMAL HIGH (ref <130)
Total CHOL/HDL Ratio: 3.8 (calc) (ref <5.0)
Triglycerides: 103 mg/dL (ref <150)

## 2020-04-11 LAB — TSH: TSH: 4.15 mIU/L (ref 0.40–4.50)

## 2020-04-11 NOTE — Progress Notes (Signed)
Please inform patient of the following:  Cholesterol borderline elevated.  Everything else is normal.  Do not need to start meds.  He can continue working on diet and exercise and we can recheck in year.

## 2020-04-17 ENCOUNTER — Other Ambulatory Visit: Payer: Self-pay | Admitting: Family Medicine

## 2020-07-11 ENCOUNTER — Telehealth: Payer: Self-pay

## 2020-07-11 NOTE — Telephone Encounter (Signed)
Pt is requesting a call in regards to taking a mucinex and his medical history.

## 2021-01-20 ENCOUNTER — Other Ambulatory Visit: Payer: Self-pay

## 2021-01-20 ENCOUNTER — Emergency Department (HOSPITAL_BASED_OUTPATIENT_CLINIC_OR_DEPARTMENT_OTHER): Payer: No Typology Code available for payment source | Admitting: Radiology

## 2021-01-20 ENCOUNTER — Emergency Department (HOSPITAL_BASED_OUTPATIENT_CLINIC_OR_DEPARTMENT_OTHER)
Admission: EM | Admit: 2021-01-20 | Discharge: 2021-01-20 | Disposition: A | Discharge status: Home / Self Care | Payer: No Typology Code available for payment source | Attending: Emergency Medicine | Admitting: Emergency Medicine

## 2021-01-20 ENCOUNTER — Encounter (HOSPITAL_BASED_OUTPATIENT_CLINIC_OR_DEPARTMENT_OTHER): Payer: Self-pay | Admitting: Obstetrics and Gynecology

## 2021-01-20 DIAGNOSIS — Y9301 Activity, walking, marching and hiking: Secondary | ICD-10-CM | POA: Diagnosis not present

## 2021-01-20 DIAGNOSIS — S0121XA Laceration without foreign body of nose, initial encounter: Secondary | ICD-10-CM | POA: Diagnosis not present

## 2021-01-20 DIAGNOSIS — I129 Hypertensive chronic kidney disease with stage 1 through stage 4 chronic kidney disease, or unspecified chronic kidney disease: Secondary | ICD-10-CM | POA: Insufficient documentation

## 2021-01-20 DIAGNOSIS — E039 Hypothyroidism, unspecified: Secondary | ICD-10-CM | POA: Diagnosis not present

## 2021-01-20 DIAGNOSIS — N189 Chronic kidney disease, unspecified: Secondary | ICD-10-CM | POA: Insufficient documentation

## 2021-01-20 DIAGNOSIS — S0992XA Unspecified injury of nose, initial encounter: Secondary | ICD-10-CM

## 2021-01-20 DIAGNOSIS — Z79899 Other long term (current) drug therapy: Secondary | ICD-10-CM | POA: Insufficient documentation

## 2021-01-20 DIAGNOSIS — W228XXA Striking against or struck by other objects, initial encounter: Secondary | ICD-10-CM | POA: Diagnosis not present

## 2021-01-20 DIAGNOSIS — T148XXA Other injury of unspecified body region, initial encounter: Secondary | ICD-10-CM

## 2021-01-20 DIAGNOSIS — S299XXA Unspecified injury of thorax, initial encounter: Secondary | ICD-10-CM | POA: Diagnosis present

## 2021-01-20 DIAGNOSIS — Y92195 Garage of other specified residential institution as the place of occurrence of the external cause: Secondary | ICD-10-CM | POA: Diagnosis not present

## 2021-01-20 DIAGNOSIS — S29012A Strain of muscle and tendon of back wall of thorax, initial encounter: Secondary | ICD-10-CM | POA: Insufficient documentation

## 2021-01-20 DIAGNOSIS — S39012A Strain of muscle, fascia and tendon of lower back, initial encounter: Secondary | ICD-10-CM

## 2021-01-20 NOTE — ED Provider Notes (Signed)
[facility] EMERGENCY DEPT Provider Note   CSN: [id number] Arrival date & time: 01/20/21  1600     History Chief Complaint  Patient presents with   Facial Injury    Steve Olson is a 55 y.o. male.  Presents for facial injury.  Patient accidentally walked into garage door and hit his face, suffering a small laceration to his nose.  He then fell backwards and hit his upper back on the ground.  Denies hitting his head when he fell to the ground.  No loss of consciousness.  Feels fine at present.  No nausea or vomiting.  No nose bleed.  Not on blood thinners.  HPI     Past Medical History:  Diagnosis Date   Allergy    Arthritis    Chronic kidney disease    Kidney Stone   Hypertension    Thyroid disease     Patient Active Problem List   Diagnosis Date Noted   Dyslipidemia 04/11/2020   Hand pain 04/10/2020   Hypothyroidism 08/05/2013   Hypertension 07/13/2012    Past Surgical History:  Procedure Laterality Date   ANTERIOR CRUCIATE LIGAMENT REPAIR  2000   basketball injury       Family History  Problem Relation Age of Onset   Breast cancer Mother    Heart disease Father    Heart attack Paternal Grandfather    Colon cancer Neg Hx    Esophageal cancer Neg Hx    Rectal cancer Neg Hx    Stomach cancer Neg Hx     Social History   Tobacco Use   Smoking status: Never   Smokeless tobacco: Never  Vaping Use   Vaping Use: Never used  Substance Use Topics   Alcohol use: No   Drug use: No    Home Medications Prior to Admission medications   Medication Sig Start Date End Date Taking? Authorizing Provider  aspirin-acetaminophen-caffeine (EXCEDRIN MIGRAINE) [phone] MG per tablet Take 1 tablet by mouth every 6 (six) hours as needed for pain (headache).    [provider]  Biotin 1000 MCG tablet Take 1,000 mcg by mouth daily.    [provider]  ibuprofen (ADVIL) 200 MG tablet Take 200 mg by mouth every 6 (six) hours as needed.      [provider]  levothyroxine (SYNTHROID) 125 MCG tablet TAKE ONE TABLET BY MOUTH DAILY 04/17/20   Ardith Dark, MD    Allergies    Penicillins  Review of Systems   Review of Systems  Constitutional:  Negative for chills and fever.  HENT:  Positive for facial swelling. Negative for ear pain and sore throat.   Eyes:  Negative for pain and visual disturbance.  Respiratory:  Negative for cough and shortness of breath.   Cardiovascular:  Negative for chest pain and palpitations.  Gastrointestinal:  Negative for abdominal pain and vomiting.  Genitourinary:  Negative for dysuria and hematuria.  Musculoskeletal:  Positive for back pain. Negative for arthralgias.  Skin:  Negative for color change and rash.  Neurological:  Negative for seizures and syncope.  All other systems reviewed and are negative.  Physical Exam Updated Vital Signs BP (!) 166/95   Pulse (!) 54   Temp 97.9 F (36.6 C) (Oral)   Resp 17   SpO2 99%   Physical Exam Vitals and nursing note reviewed.  Constitutional:      Appearance: He is well-developed.  HENT:     Head: Normocephalic.     Comments: There  is a superficial abrasion at the bridge of nose, no active bleeding noted, no tenderness to palpation throughout entirety of nose, no nasal septal hematoma Eyes:     Conjunctiva/sclera: Conjunctivae normal.  Cardiovascular:     Rate and Rhythm: Normal rate and regular rhythm.     Heart sounds: No murmur heard. Pulmonary:     Effort: Pulmonary effort is normal. No respiratory distress.     Breath sounds: Normal breath sounds.  Abdominal:     Palpations: Abdomen is soft.     Tenderness: There is no abdominal tenderness.  Musculoskeletal:     Cervical back: Neck supple.     Comments: Back: There is a superficial abrasion to the upper T-spine region, some tenderness to the upper T-spine, no pain over L-spine or C-spine  Skin:    General: Skin is warm and dry.  Neurological:     General: No focal  deficit present.     Mental Status: He is alert.    ED Results / Procedures / Treatments   Labs (all labs ordered are listed, but only abnormal results are displayed) Labs Reviewed - No data to display  EKG None  Radiology DG Thoracic Spine 2 View  Result Date: 01/20/2021 CLINICAL DATA:  Back trauma. EXAM: THORACIC SPINE 2 VIEWS COMPARISON:  None. FINDINGS: Limited evaluation due to overlapping osseous structures and overlying soft tissues . Multilevel degenerative changes. There is no evidence of thoracic spine fracture. Alignment is normal. No other significant bone abnormalities are identified. IMPRESSION: No radiographic finding of acute displaced fracture or traumatic listhesis of the thoracic spine. Electronically Signed   By: Tish Frederickson M.D.   On: 01/20/2021 21:23    Procedures Procedures   Medications Ordered in ED Medications - No data to display  ED Course  I have reviewed the triage vital signs and the nursing notes.  Pertinent labs & imaging results that were available during my care of the patient were reviewed by me and considered in my medical decision making (see chart for details).    MDM Rules/Calculators/A&P                           55 year old male presented to ER with concern for injury to his face and back.  Regarding the face, there was is a small abrasion to the bridge of his nose, not requiring suture repair.  No tenderness and no deformity to the rest of the nose, low suspicion for nasal fracture.  Did have a small abrasion and some pain in his T-spine.  X-rays were negative.  If he has any ongoing nose pain or notices any nasal deformity recommended he follow-up with ENT.  Discharged home.    After the discussed management above, the patient was determined to be safe for discharge.  The patient was in agreement with this plan and all questions regarding their care were answered.  ED return precautions were discussed and the patient will return to  the ED with any significant worsening of condition.  Final Clinical Impression(s) / ED Diagnoses Final diagnoses:  Injury of nose, initial encounter  Abrasion  Back strain, initial encounter    Rx / DC Orders ED Discharge Orders     None        Milagros Loll, MD 01/21/21 2351

## 2021-01-20 NOTE — ED Notes (Signed)
Patient transported to X-ray 

## 2021-01-20 NOTE — ED Notes (Signed)
Pt verbalizes understanding of discharge instructions. Opportunity for questioning and answers were provided. Armand removed by staff, pt discharged from ED to home. Educated to f/u with ENT

## 2021-01-20 NOTE — ED Triage Notes (Signed)
Patient reports to the ER for facial injury. Patient reports a garage door did not go all the way up and he ran into it. Patient reports he is unsure of last tetanus shot.

## 2021-01-20 NOTE — Discharge Instructions (Addendum)
Recommend following up with ENT regarding your nose injury.  Take Tylenol or Motrin for pain control.  Come back to ER as needed for new or worsening symptoms.

## 2021-03-06 ENCOUNTER — Other Ambulatory Visit: Payer: Self-pay | Admitting: Family Medicine

## 2021-04-11 ENCOUNTER — Ambulatory Visit (INDEPENDENT_AMBULATORY_CARE_PROVIDER_SITE_OTHER): Payer: No Typology Code available for payment source | Admitting: Family Medicine

## 2021-04-11 ENCOUNTER — Other Ambulatory Visit: Payer: Self-pay

## 2021-04-11 ENCOUNTER — Encounter: Payer: Self-pay | Admitting: Family Medicine

## 2021-04-11 VITALS — BP 159/93 | HR 60 | Temp 97.7°F | Ht 74.0 in | Wt 214.0 lb

## 2021-04-11 DIAGNOSIS — E785 Hyperlipidemia, unspecified: Secondary | ICD-10-CM | POA: Diagnosis not present

## 2021-04-11 DIAGNOSIS — I10 Essential (primary) hypertension: Secondary | ICD-10-CM | POA: Diagnosis not present

## 2021-04-11 DIAGNOSIS — E039 Hypothyroidism, unspecified: Secondary | ICD-10-CM

## 2021-04-11 DIAGNOSIS — Z0001 Encounter for general adult medical examination with abnormal findings: Secondary | ICD-10-CM | POA: Diagnosis not present

## 2021-04-11 DIAGNOSIS — R739 Hyperglycemia, unspecified: Secondary | ICD-10-CM | POA: Diagnosis not present

## 2021-04-11 DIAGNOSIS — Z131 Encounter for screening for diabetes mellitus: Secondary | ICD-10-CM

## 2021-04-11 DIAGNOSIS — L219 Seborrheic dermatitis, unspecified: Secondary | ICD-10-CM

## 2021-04-11 LAB — COMPREHENSIVE METABOLIC PANEL
ALT: 23 U/L (ref 0–53)
AST: 24 U/L (ref 0–37)
Albumin: 4.4 g/dL (ref 3.5–5.2)
Alkaline Phosphatase: 48 U/L (ref 39–117)
BUN: 14 mg/dL (ref 6–23)
CO2: 29 mEq/L (ref 19–32)
Calcium: 9.6 mg/dL (ref 8.4–10.5)
Chloride: 104 mEq/L (ref 96–112)
Creatinine, Ser: 1.21 mg/dL (ref 0.40–1.50)
GFR: 67.91 mL/min (ref >60.00)
Glucose, Bld: 111 mg/dL — ABNORMAL HIGH (ref 70–99)
Potassium: 4.1 mEq/L (ref 3.5–5.1)
Sodium: 140 mEq/L (ref 135–145)
Total Bilirubin: 0.7 mg/dL (ref 0.2–1.2)
Total Protein: 7.1 g/dL (ref 6.0–8.3)

## 2021-04-11 LAB — LIPID PANEL
Cholesterol: 167 mg/dL (ref 0–200)
HDL: 47 mg/dL (ref >39.00)
LDL Cholesterol: 92 mg/dL (ref 0–99)
NonHDL: 120.16
Total CHOL/HDL Ratio: 4
Triglycerides: 139 mg/dL (ref 0.0–149.0)
VLDL: 27.8 mg/dL (ref 0.0–40.0)

## 2021-04-11 LAB — CBC
HCT: 46.3 % (ref 39.0–52.0)
Hemoglobin: 15.4 g/dL (ref 13.0–17.0)
MCHC: 33.3 g/dL (ref 30.0–36.0)
MCV: 98.1 fl (ref 78.0–100.0)
Platelets: 252 10*3/uL (ref 150.0–400.0)
RBC: 4.72 Mil/uL (ref 4.22–5.81)
RDW: 12.9 % (ref 11.5–15.5)
WBC: 5.3 10*3/uL (ref 4.0–10.5)

## 2021-04-11 LAB — TSH: TSH: 5.76 u[IU]/mL — ABNORMAL HIGH (ref 0.35–5.50)

## 2021-04-11 LAB — HEMOGLOBIN A1C: Hgb A1c MFr Bld: 5.4 % (ref 4.6–6.5)

## 2021-04-11 MED ORDER — KETOCONAZOLE 2 % EX SHAM: 1.0000 "application " | MEDICATED_SHAMPOO | CUTANEOUS | 0 refills | Status: DC

## 2021-04-11 NOTE — Patient Instructions (Signed)
It was very nice to see you today!  Please use the ketoconazole.  You have seborrheic dermatitis.  You can use the shampoo as needed for flareups.  Keep an eye on your blood pressure and let me know if it is persistently 140/90 or higher.  We will check blood work today.  Please continue working on diet and exercise.  I will see you back in 1 year.  Come back to see me sooner if needed.  Take care, Dr Jimmey Ralph  PLEASE NOTE:  If you had any lab tests please let us know if you have not heard back within a few days. You may see your results on mychart before we have a chance to review them but we will give you a call once they are reviewed by [place]. If we ordered any referrals today, please let us know if you have not heard from their office within the next week.   Please try these tips to maintain a healthy lifestyle:  Eat at least 3 REAL meals and 1-2 snacks per day.  Aim for no more than 5 hours between eating.  If you eat breakfast, please do so within one hour of getting up.   Each meal should contain half fruits/vegetables, one quarter protein, and one quarter carbs (no bigger than a computer mouse)  Cut down on sweet beverages. This includes juice, soda, and sweet tea.   Drink at least 1 glass of water with each meal and aim for at least 8 glasses per day  Exercise at least 150 minutes every week.    Preventive Care 30-75 Years Old, Male Preventive care refers to lifestyle choices and visits with your health care provider that can promote health and wellness. This includes: A yearly physical exam. This is also called an annual wellness visit. Regular dental and eye exams. Immunizations. Screening for certain conditions. Healthy lifestyle choices, such as: Eating a healthy diet. Getting regular exercise. Not using drugs or products that contain nicotine and tobacco. Limiting alcohol use. What can I expect for my preventive care visit? Physical exam Your health care provider  will check your: Height and weight. These may be used to calculate your BMI (body mass index). BMI is a measurement that tells if you are at a healthy weight. Heart rate and blood pressure. Body temperature. Skin for abnormal spots. Counseling Your health care provider may ask you questions about your: Past medical problems. Family's medical history. Alcohol, tobacco, and drug use. Emotional well-being. Home life and relationship well-being. Sexual activity. Diet, exercise, and sleep habits. Work and work Astronomer. Access to firearms. What immunizations do I need? Vaccines are usually given at various ages, according to a schedule. Your health care provider will recommend vaccines for you based on your age, medical history, and lifestyle or other factors, such as travel or where you work. What tests do I need? Blood tests Lipid and cholesterol levels. These may be checked every 5 years, or more often if you are over 21 years old. Hepatitis C test. Hepatitis B test. Screening Lung cancer screening. You may have this screening every year starting at age 12 if you have a 30-pack-year history of smoking and currently smoke or have quit within the past 15 years. Prostate cancer screening. Recommendations will vary depending on your family history and other risks. Genital exam to check for testicular cancer or hernias. Colorectal cancer screening. All adults should have this screening starting at age 83 and continuing until age 52. Your health  care provider may recommend screening at age 54 if you are at increased risk. You will have tests every 1-10 years, depending on your results and the type of screening test. Diabetes screening. This is done by checking your blood sugar (glucose) after you have not eaten for a while (fasting). You may have this done every 1-3 years. STD (sexually transmitted disease) testing, if you are at risk. Follow these instructions at home: Eating and  drinking  Eat a diet that includes fresh fruits and vegetables, whole grains, lean protein, and low-fat dairy products. Take vitamin and mineral supplements as recommended by your health care provider. Do not drink alcohol if your health care provider tells you not to drink. If you drink alcohol: Limit how much you have to 0-2 drinks a day. Be aware of how much alcohol is in your drink. In the [place] one drink equals one 12 oz bottle of beer (355 mL), one 5 oz glass of wine (148 mL), or one 1 oz glass of hard liquor (44 mL). Lifestyle Take daily care of your teeth and gums. Brush your teeth every morning and night with fluoride toothpaste. Floss one time each day. Stay active. Exercise for at least 30 minutes 5 or more days each week. Do not use any products that contain nicotine or tobacco, such as cigarettes, e-cigarettes, and chewing tobacco. If you need help quitting, ask your health care provider. Do not use drugs. If you are sexually active, practice safe sex. Use a condom or other form of protection to prevent STIs (sexually transmitted infections). If told by your health care provider, take low-dose aspirin daily starting at age 32. Find healthy ways to cope with stress, such as: Meditation, yoga, or listening to music. Journaling. Talking to a trusted person. Spending time with friends and family. Safety Always wear your seat belt while driving or riding in a vehicle. Do not drive: If you have been drinking alcohol. Do not ride with someone who has been drinking. When you are tired or distracted. While texting. Wear a helmet and other protective equipment during sports activities. If you have firearms in your house, make sure you follow all gun safety procedures. What's next? Go to your health care provider once a year for an annual wellness visit. Ask your health care provider how often you should have your eyes and teeth checked. Stay up to date on all vaccines. This  information is not intended to replace advice given to you by your health care provider. Make sure you discuss any questions you have with your health care provider. Document Revised: 08/17/2020 Document Reviewed: 06/03/2018 Elsevier Patient Education  2022 [employer].

## 2021-04-11 NOTE — Assessment & Plan Note (Signed)
Check labs 

## 2021-04-11 NOTE — Assessment & Plan Note (Signed)
Start topical ketoconazole. 

## 2021-04-11 NOTE — Progress Notes (Signed)
Chief Complaint:  Steve Olson is a 55 y.o. male who presents today for his annual comprehensive physical exam.    Assessment/Plan:  Chronic Problems Addressed Today: Hypothyroidism Check TSH. He is on levothyroxine daily.   Hypertension Above goal.  Not on meds.  Has been under stress recently.  Has typically been well controlled.  He will continue home monitoring.  Goal 140/90 or lower.  He will check with me in a few weeks via MyChart.  Seborrheic dermatitis Start topical ketoconazole.   Dyslipidemia Check labs.   Body mass index is 27.48 kg/m. / Overweight  BMI Metric Follow Up - 04/11/21 0946       BMI Metric Follow Up-Please document annually   BMI Metric Follow Up Education provided              Preventative Healthcare: Declined flu vaccine. UTD on colonoscopy. Will get blood work done today.   Patient Counseling(The following topics were reviewed and/or handout was given):  -Nutrition: Stressed importance of moderation in sodium/caffeine intake, saturated fat and cholesterol, caloric balance, sufficient intake of fresh fruits, vegetables, and fiber.  -Stressed the importance of regular exercise.   -Substance Abuse: Discussed cessation/primary prevention of tobacco, alcohol, or other drug use; driving or other dangerous activities under the influence; availability of treatment for abuse.   -Injury prevention: Discussed safety belts, safety helmets, smoke detector, smoking near bedding or upholstery.   -Sexuality: Discussed sexually transmitted diseases, partner selection, use of condoms, avoidance of unintended pregnancy and contraceptive alternatives.   -Dental health: Discussed importance of regular tooth brushing, flossing, and dental visits.  -Health maintenance and immunizations reviewed. Please refer to Health maintenance section.  Return to care in 1 year for next preventative visit.     Subjective:  HPI:  He has no acute complaints today.    He is here with chest rash. He reports it started few weeks ago. He notes he often have rash around his chest. Comes and goes. No specific treatments tried.   His blood pressure at office was elevated today. He is not taking medication. He reports this could be due to stress. He has been under lot of stress due to Covid issues and home renovation  He has gained  about 25 pounds.   Lifestyle Diet: Balanced Exercise: Walking  Depression screen [facility] 2/9 04/11/2021  Decreased Interest 0  Down, Depressed, Hopeless 0  PHQ - 2 Score 0    Health Maintenance Due  Topic Date Due   HIV Screening  Never done   Hepatitis C Screening  Never done   TETANUS/TDAP  Never done   Zoster Vaccines- Shingrix (1 of 2) Never done     ROS: Per HPI, otherwise a complete review of systems was negative.   PMH:  The following were reviewed and entered/updated in epic: Past Medical History:  Diagnosis Date   Allergy    Arthritis    Chronic kidney disease    Kidney Stone   Hypertension    Thyroid disease    Patient Active Problem List   Diagnosis Date Noted   Seborrheic dermatitis 04/11/2021   Dyslipidemia 04/11/2020   Hand pain 04/10/2020   Hypothyroidism 08/05/2013   Hypertension 07/13/2012   Past Surgical History:  Procedure Laterality Date   ANTERIOR CRUCIATE LIGAMENT REPAIR  2000   basketball injury    Family History  Problem Relation Age of Onset   Breast cancer Mother    Heart disease Father    Heart attack  Paternal Grandfather    Colon cancer Neg Hx    Esophageal cancer Neg Hx    Rectal cancer Neg Hx    Stomach cancer Neg Hx     Medications- reviewed and updated Current Outpatient Medications  Medication Sig Dispense Refill   aspirin-acetaminophen-caffeine (EXCEDRIN MIGRAINE) 250-250-65 MG per tablet Take 1 tablet by mouth every 6 (six) hours as needed for pain (headache).     Biotin 1000 MCG tablet Take 1,000 mcg by mouth daily.     ibuprofen (ADVIL) 200 MG tablet Take  200 mg by mouth every 6 (six) hours as needed.      ketoconazole (NIZORAL) 2 % shampoo Apply 1 application topically 2 (two) times a week. 120 mL 0   levothyroxine (SYNTHROID) 125 MCG tablet TAKE ONE TABLET BY MOUTH DAILY 90 tablet 3   No current facility-administered medications for this visit.    Allergies-reviewed and updated Allergies  Allergen Reactions   Penicillins Other (See Comments)    Family allergic pt does not take    Social History   Socioeconomic History   Marital status: Married    Spouse name: Not on file   Number of children: Not on file   Years of education: Not on file   Highest education level: Not on file  Occupational History   Not on file  Tobacco Use   Smoking status: Never   Smokeless tobacco: Never  Vaping Use   Vaping Use: Never used  Substance and Sexual Activity   Alcohol use: No   Drug use: No   Sexual activity: Yes  Other Topics Concern   Not on file  Social History Narrative   Not on file   Social Determinants of Health   Financial Resource Strain: Not on file  Food Insecurity: Not on file  Transportation Needs: Not on file  Physical Activity: Not on file  Stress: Not on file  Social Connections: Not on file        Objective:  Physical Exam: BP (!) 159/93   Pulse 60   Temp 97.7 F (36.5 C) (Temporal)   Ht 6\' 2"  (1.88 m)   Wt 214 lb (97.1 kg)   SpO2 100%   BMI 27.48 kg/m   Body mass index is 27.48 kg/m. Wt Readings from Last 3 Encounters:  04/11/21 214 lb (97.1 kg)  04/10/20 202 lb (91.6 kg)  09/03/18 195 lb (88.5 kg)   Gen: NAD, resting comfortably HEENT: TMs normal bilaterally. OP clear. No thyromegaly noted.  CV: RRR with no murmurs appreciated Pulm: NWOB, CTAB with no crackles, wheezes, or rhonchi GI: Normal bowel sounds present. Soft, Nontender, Nondistended. MSK: no edema, cyanosis, or clubbing noted Skin: warm, dry.  Erythematous rash in mid chest.  Small amount of flaking present. Neuro: CN2-12 grossly  intact. Strength 5/5 in upper and lower extremities. Reflexes symmetric and intact bilaterally.  Psych: Normal affect and thought content      I,Savera Zaman,acting as a scribe for 09/05/18, MD.,have documented all relevant documentation on the behalf of [name], MD,as directed by  [name], MD while in the presence of [name], MD.   I, [name], MD, have reviewed all documentation for this visit. The documentation on 04/11/21 for the exam, diagnosis, procedures, and orders are all accurate and complete.  04/13/21. Katina Degree, MD 04/11/2021 9:47 AM

## 2021-04-11 NOTE — Assessment & Plan Note (Signed)
Above goal.  Not on meds.  Has been under stress recently.  Has typically been well controlled.  He will continue home monitoring.  Goal 140/90 or lower.  He will check with me in a few weeks via MyChart.

## 2021-04-11 NOTE — Assessment & Plan Note (Signed)
Check TSH. He is on levothyroxine daily.

## 2021-04-12 ENCOUNTER — Other Ambulatory Visit: Payer: Self-pay | Admitting: *Deleted

## 2021-04-12 ENCOUNTER — Telehealth: Payer: Self-pay

## 2021-04-12 DIAGNOSIS — E039 Hypothyroidism, unspecified: Secondary | ICD-10-CM

## 2021-04-12 NOTE — Progress Notes (Signed)
Please inform patient of the following:  His TSH is up just a little bit. Recommend he come back in a few weeks to recheck before we make any adjustments to his thyroid medications.   Everything else is NORMAL and we can recheck in a year.

## 2021-04-12 NOTE — Telephone Encounter (Signed)
See results note. 

## 2021-04-12 NOTE — Telephone Encounter (Signed)
Patient has called back in regard to labs.  I have read patient Dr. Lavone Neri response.   Patient understood and has scheduled TSH lab.

## 2021-05-01 ENCOUNTER — Other Ambulatory Visit: Payer: No Typology Code available for payment source

## 2021-08-16 IMAGING — DX DG THORACIC SPINE 2V
3 series · 3 of 3 positions shown · non-contrast
Comparison: None.

CLINICAL DATA: Back trauma.

EXAM:
THORACIC SPINE 2 VIEWS

[t-spine ap]
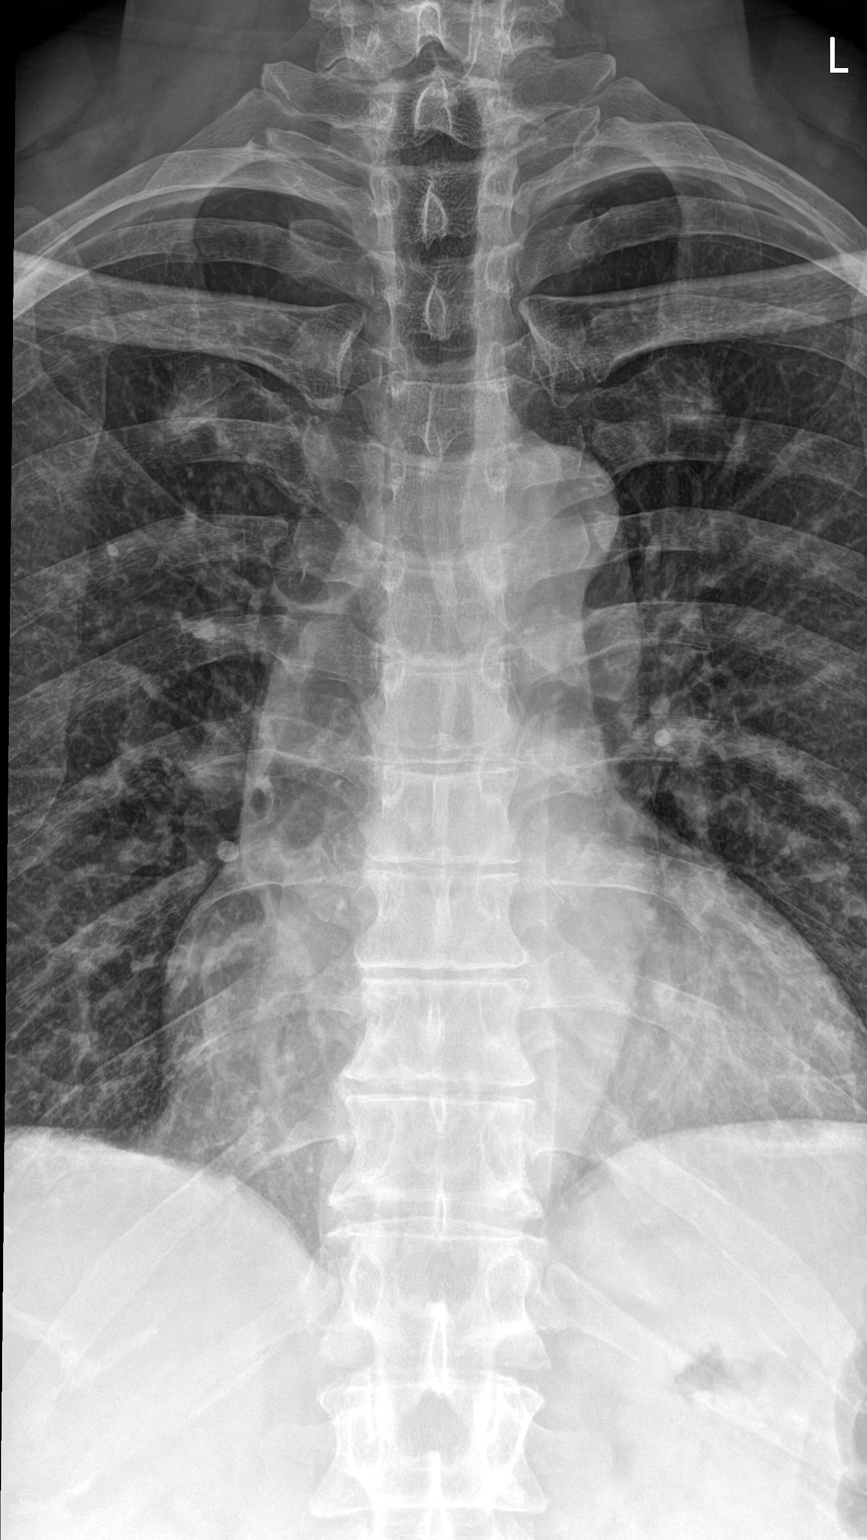

[t-spine lat]
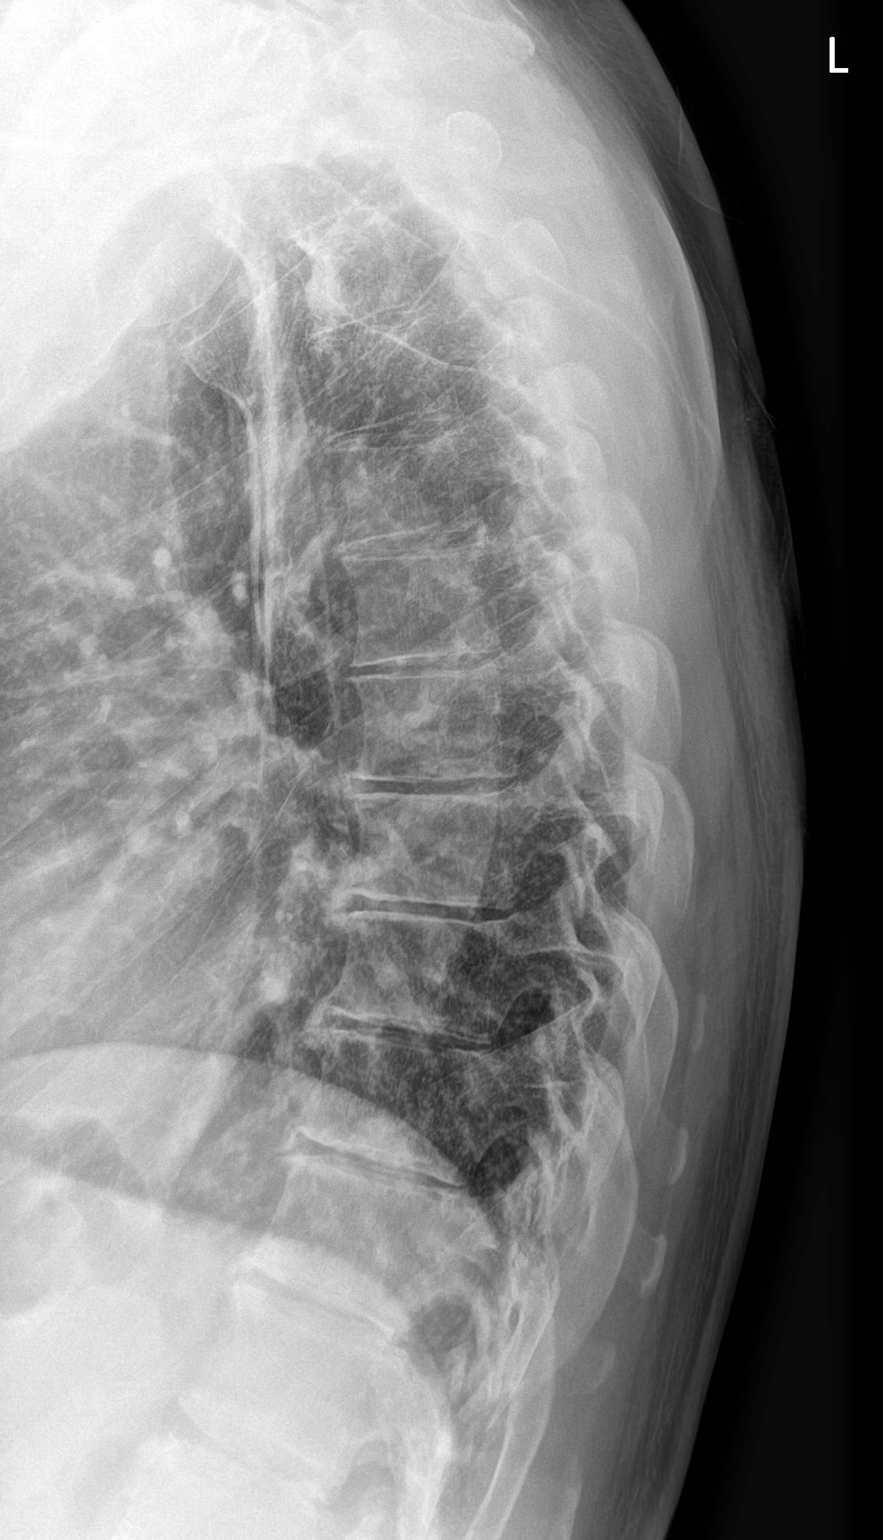

[ct-spine swimmers]
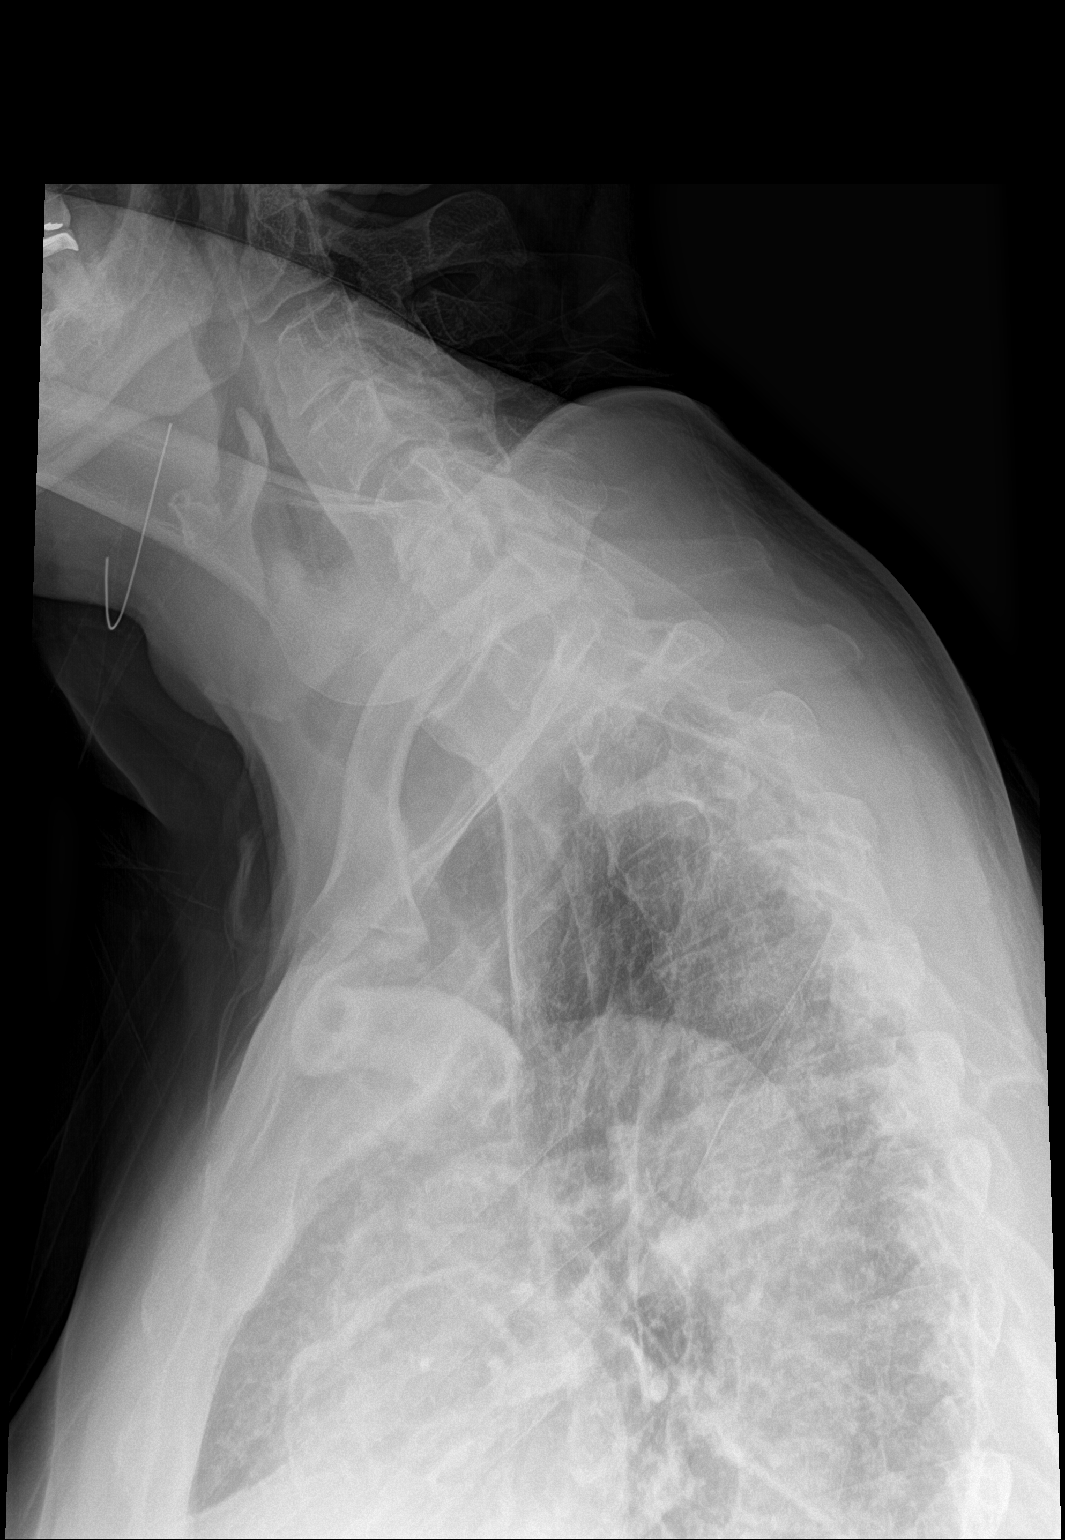

[3 of 3 positions shown; findings below may reference images not displayed]

FINDINGS: Limited evaluation due to overlapping osseous structures and
overlying soft tissues . Multilevel degenerative changes. There is
no evidence of thoracic spine fracture. Alignment is normal. No
other significant bone abnormalities are identified.
IMPRESSION: No radiographic finding of acute displaced fracture or traumatic
listhesis of the thoracic spine.

## 2021-11-08 ENCOUNTER — Ambulatory Visit: Payer: No Typology Code available for payment source | Admitting: Family Medicine

## 2021-11-08 ENCOUNTER — Other Ambulatory Visit (HOSPITAL_COMMUNITY)
Admission: RE | Admit: 2021-11-08 | Discharge: 2021-11-08 | Disposition: A | Discharge status: Home / Self Care | Payer: No Typology Code available for payment source | Source: Ambulatory Visit | Attending: Family Medicine | Admitting: Family Medicine

## 2021-11-08 ENCOUNTER — Encounter: Payer: Self-pay | Admitting: Family Medicine

## 2021-11-08 VITALS — BP 174/110 | HR 60 | Temp 98.1°F | Ht 74.0 in | Wt 206.6 lb

## 2021-11-08 DIAGNOSIS — R369 Urethral discharge, unspecified: Secondary | ICD-10-CM

## 2021-11-08 DIAGNOSIS — I10 Essential (primary) hypertension: Secondary | ICD-10-CM | POA: Diagnosis not present

## 2021-11-08 LAB — URINALYSIS, ROUTINE W REFLEX MICROSCOPIC
Bilirubin Urine: NEGATIVE
Ketones, ur: NEGATIVE
Leukocytes,Ua: NEGATIVE
Nitrite: NEGATIVE
Specific Gravity, Urine: 1.015 (ref 1.000–1.030)
Total Protein, Urine: NEGATIVE
Urine Glucose: NEGATIVE
Urobilinogen, UA: 0.2 (ref 0.0–1.0)
pH: 6 (ref 5.0–8.0)

## 2021-11-08 LAB — CBC
HCT: 46.7 % (ref 39.0–52.0)
Hemoglobin: 15.6 g/dL (ref 13.0–17.0)
MCHC: 33.5 g/dL (ref 30.0–36.0)
MCV: 95.9 fl (ref 78.0–100.0)
Platelets: 259 10*3/uL (ref 150.0–400.0)
RBC: 4.87 Mil/uL (ref 4.22–5.81)
RDW: 13.1 % (ref 11.5–15.5)
WBC: 6.5 10*3/uL (ref 4.0–10.5)

## 2021-11-08 LAB — PSA: PSA: 0.48 ng/mL (ref 0.10–4.00)

## 2021-11-08 NOTE — Patient Instructions (Signed)
It was very nice to see you today!  We will check blood work and urine sample to make sure that you do not have any forms of infection that could be causing your symptoms.  It is possible that she had a little bit of bleeding in the urinary tract possibly from a kidney stone that caused her symptoms.  Depending on your results we may need to have you follow back up with urology.  Please keep an eye on your blood pressure and let us know if it is persistently 140/90 or higher.  Take care, Dr Jimmey Ralph  PLEASE NOTE:  If you had any lab tests please let us know if you have not heard back within a few days. You may see your results on mychart before we have a chance to review them but we will give you a call once they are reviewed by [place]. If we ordered any referrals today, please let us know if you have not heard from their office within the next week.   Please try these tips to maintain a healthy lifestyle:  Eat at least 3 REAL meals and 1-2 snacks per day.  Aim for no more than 5 hours between eating.  If you eat breakfast, please do so within one hour of getting up.   Each meal should contain half fruits/vegetables, one quarter protein, and one quarter carbs (no bigger than a computer mouse)  Cut down on sweet beverages. This includes juice, soda, and sweet tea.   Drink at least 1 glass of water with each meal and aim for at least 8 glasses per day  Exercise at least 150 minutes every week.

## 2021-11-08 NOTE — Assessment & Plan Note (Signed)
Above goal today.  He has been under a lot of stress recently and is worried about possible infection.  He will continue home monitoring with goal 140/90 or lower and let us know if persistently elevated.

## 2021-11-08 NOTE — Progress Notes (Signed)
   Steve Olson is a 56 y.o. male who presents today for an office visit.  Assessment/Plan:  New/Acute Problems: Abnormal Ejaculate  No red flag signs or symptoms.  He has not had a continued penile discharge or significant dysuria -doubt infection however we will check urine sample to rule out today.  He also request PSA.  Discussed with patient this would be a nonspecific test however would be useful if his PSA level was low.  We will also check CBC as well.  It is possible that he could have had a minor urothelial abrasion due to his recent kidney stone that caused abnormal color.  If work-up above is not revealing will need referral to urology.  Chronic Problems Addressed Today: Hypertension Above goal today.  He has been under a lot of stress recently and is worried about possible infection.  He will continue home monitoring with goal 140/90 or lower and let us know if persistently elevated.     Subjective:  HPI:  Patient here with concern for abnormal penile discharge.  Patient states he noticed a brown discoloration to his ejaculate after intercourse a couple of days ago.  He has not had any ongoing since symptoms since then.  Is never anything like this before.  He has a bit of discomfort with urination.  He is worried about infection.  No fevers or chills.  No testicular pain.  No perineal pain.  No malaise.  He was dealing with kidney stones a few weeks ago but this seems to have resolved.  No fevers or chills.        Objective:  Physical Exam: BP (!) 174/110 (BP Location: Right Arm) Comment: Manual  Pulse 60   Temp 98.1 F (36.7 C) (Temporal)   Ht 6\' 2"  (1.88 m)   Wt 206 lb 9.6 oz (93.7 kg)   SpO2 99%   BMI 26.53 kg/m   Gen: No acute distress, resting comfortably CV: Regular rate and rhythm with no murmurs appreciated Pulm: Normal work of breathing, clear to auscultation bilaterally with no crackles, wheezes, or rhonchi Neuro: Grossly normal, moves all  extremities Psych: Normal affect and thought content      Sakiya Stepka M. , MD 11/08/2021 9:53 AM

## 2021-11-09 LAB — URINE CULTURE
MICRO NUMBER:: 13420265
Result:: NO GROWTH
SPECIMEN QUALITY:: ADEQUATE

## 2021-11-11 LAB — URINE CYTOLOGY ANCILLARY ONLY
Chlamydia: NEGATIVE
Comment: NEGATIVE
Comment: NORMAL
Neisseria Gonorrhea: NEGATIVE

## 2021-11-12 ENCOUNTER — Other Ambulatory Visit: Payer: Self-pay | Admitting: *Deleted

## 2021-11-12 DIAGNOSIS — R319 Hematuria, unspecified: Secondary | ICD-10-CM

## 2021-11-12 NOTE — Progress Notes (Signed)
Please inform patient of the following:  He had a trace amount of blood in his urine but all of his other testing including testing for infections were negative.  He may have had a kidney stone that passed.  He should come back here in 1 to 2 weeks to recheck a urinalysis to make sure that the blood is cleared or he can follow-up with urology.

## 2021-11-26 ENCOUNTER — Other Ambulatory Visit: Payer: No Typology Code available for payment source

## 2021-11-26 ENCOUNTER — Telehealth: Payer: Self-pay | Admitting: Family Medicine

## 2021-11-26 NOTE — Telephone Encounter (Signed)
Pt was called to reschedule lab appointment for 11/26/21.   Pt states he had a urinalysis at [facility] urology. Patient declined reschedule and directed team to the speciality office for follow up.

## 2022-03-17 ENCOUNTER — Encounter: Payer: Self-pay | Admitting: *Deleted

## 2022-04-11 ENCOUNTER — Encounter: Payer: No Typology Code available for payment source | Admitting: Family Medicine

## 2022-04-11 ENCOUNTER — Ambulatory Visit (INDEPENDENT_AMBULATORY_CARE_PROVIDER_SITE_OTHER): Payer: No Typology Code available for payment source | Admitting: Family Medicine

## 2022-04-11 ENCOUNTER — Encounter: Payer: Self-pay | Admitting: Family Medicine

## 2022-04-11 VITALS — BP 193/127 | HR 66 | Temp 98.2°F | Resp 16 | Ht 74.0 in | Wt 212.4 lb

## 2022-04-11 DIAGNOSIS — E039 Hypothyroidism, unspecified: Secondary | ICD-10-CM

## 2022-04-11 DIAGNOSIS — E785 Hyperlipidemia, unspecified: Secondary | ICD-10-CM | POA: Diagnosis not present

## 2022-04-11 DIAGNOSIS — Z125 Encounter for screening for malignant neoplasm of prostate: Secondary | ICD-10-CM

## 2022-04-11 DIAGNOSIS — L659 Nonscarring hair loss, unspecified: Secondary | ICD-10-CM

## 2022-04-11 DIAGNOSIS — I10 Essential (primary) hypertension: Secondary | ICD-10-CM

## 2022-04-11 DIAGNOSIS — R739 Hyperglycemia, unspecified: Secondary | ICD-10-CM | POA: Diagnosis not present

## 2022-04-11 DIAGNOSIS — Z0001 Encounter for general adult medical examination with abnormal findings: Secondary | ICD-10-CM | POA: Diagnosis not present

## 2022-04-11 DIAGNOSIS — J310 Chronic rhinitis: Secondary | ICD-10-CM | POA: Diagnosis not present

## 2022-04-11 DIAGNOSIS — F439 Reaction to severe stress, unspecified: Secondary | ICD-10-CM | POA: Insufficient documentation

## 2022-04-11 DIAGNOSIS — L989 Disorder of the skin and subcutaneous tissue, unspecified: Secondary | ICD-10-CM

## 2022-04-11 LAB — COMPREHENSIVE METABOLIC PANEL
ALT: 19 U/L (ref 0–53)
AST: 20 U/L (ref 0–37)
Albumin: 4.8 g/dL (ref 3.5–5.2)
Alkaline Phosphatase: 60 U/L (ref 39–117)
BUN: 16 mg/dL (ref 6–23)
CO2: 28 mEq/L (ref 19–32)
Calcium: 9.8 mg/dL (ref 8.4–10.5)
Chloride: 101 mEq/L (ref 96–112)
Creatinine, Ser: 1.17 mg/dL (ref 0.40–1.50)
GFR: 70.21 mL/min (ref >60.00)
Glucose, Bld: 104 mg/dL — ABNORMAL HIGH (ref 70–99)
Potassium: 4.3 mEq/L (ref 3.5–5.1)
Sodium: 140 mEq/L (ref 135–145)
Total Bilirubin: 1 mg/dL (ref 0.2–1.2)
Total Protein: 7.4 g/dL (ref 6.0–8.3)

## 2022-04-11 LAB — CBC
HCT: 48.5 % (ref 39.0–52.0)
Hemoglobin: 16.3 g/dL (ref 13.0–17.0)
MCHC: 33.6 g/dL (ref 30.0–36.0)
MCV: 95.8 fl (ref 78.0–100.0)
Platelets: 293 10*3/uL (ref 150.0–400.0)
RBC: 5.06 Mil/uL (ref 4.22–5.81)
RDW: 13.5 % (ref 11.5–15.5)
WBC: 6.8 10*3/uL (ref 4.0–10.5)

## 2022-04-11 LAB — TSH: TSH: 5.37 u[IU]/mL (ref 0.35–5.50)

## 2022-04-11 LAB — PSA: PSA: 0.45 ng/mL (ref 0.10–4.00)

## 2022-04-11 LAB — LIPID PANEL
Cholesterol: 230 mg/dL — ABNORMAL HIGH (ref 0–200)
HDL: 61.4 mg/dL (ref >39.00)
LDL Cholesterol: 141 mg/dL — ABNORMAL HIGH (ref 0–99)
NonHDL: 168.67
Total CHOL/HDL Ratio: 4
Triglycerides: 137 mg/dL (ref 0.0–149.0)
VLDL: 27.4 mg/dL (ref 0.0–40.0)

## 2022-04-11 LAB — IBC + FERRITIN
Ferritin: 154.3 ng/mL (ref 22.0–322.0)
Iron: 139 ug/dL (ref 42–165)
Saturation Ratios: 39.1 % (ref 20.0–50.0)
TIBC: 355.6 ug/dL (ref 250.0–450.0)
Transferrin: 254 mg/dL (ref 212.0–360.0)

## 2022-04-11 LAB — HEMOGLOBIN A1C: Hgb A1c MFr Bld: 5.7 % (ref 4.6–6.5)

## 2022-04-11 MED ORDER — AZELASTINE HCL 0.1 % NA SOLN: 2.0000 | Freq: Two times a day (BID) | NASAL | 12 refills | Status: DC

## 2022-04-11 MED ORDER — IRBESARTAN 300 MG PO TABS: 300.0000 mg | ORAL_TABLET | Freq: Every day | ORAL | 3 refills | Status: DC

## 2022-04-11 NOTE — Assessment & Plan Note (Signed)
Discussed lifestyle modifications.  Check labs. 

## 2022-04-11 NOTE — Progress Notes (Signed)
a  Chief Complaint:  Steve Olson is a 56 y.o. male who presents today for his annual comprehensive physical exam.    Assessment/Plan:  Chronic Problems Addressed Today: Hypothyroidism On levothyroxine 125 mg daily.  He recently did have a change in manufacturer.  We will check TSH today.  He is having some concurrent alopecia as well.   Hypertension Significantly elevated today.  No symptoms and no signs of endorgan damage -no indication for emergent treatment at this point.  He has been under a lot of stress recently which he thinks is the main contributor.  He has been on blood pressure meds in the past and is willing to restart.  Well with irbesartan in the past.  We will start 300 mg daily.  Check labs today.  He will continue to monitor his blood pressure at home and check in with [place] in a couple weeks.  Rhinitis Possibly allergic rhinitis.  No red flags.  We will start Astelin nasal spray as needed.  Alopecia We will check labs today.  We did discussed medication options however he is not interested in treatment at this point.  Wants to make sure there is no underlying issue causing this.  We also discussed role of stress he will be working on stress reduction.  Dyslipidemia Discussed lifestyle modifications.  Check labs.  Preventative Healthcare: Check labs.   Patient Counseling(The following topics were reviewed and/or handout was given):  -Nutrition: Stressed importance of moderation in sodium/caffeine intake, saturated fat and cholesterol, caloric balance, sufficient intake of fresh fruits, vegetables, and fiber.  -Stressed the importance of regular exercise.   -Substance Abuse: Discussed cessation/primary prevention of tobacco, alcohol, or other drug use; driving or other dangerous activities under the influence; availability of treatment for abuse.   -Injury prevention: Discussed safety belts, safety helmets, smoke detector, smoking near bedding or upholstery.   -Sexuality:  Discussed sexually transmitted diseases, partner selection, use of condoms, avoidance of unintended pregnancy and contraceptive alternatives.   -Dental health: Discussed importance of regular tooth brushing, flossing, and dental visits.  -Health maintenance and immunizations reviewed. Please refer to Health maintenance section.  Return to care in 1 year for next preventative visit.     Subjective:  HPI:  He has been having some issues with nasal congestion for the last several months. No treatments tried. No fevers or chills. Sometimes worse at night.   See a/p for status of chronic conditions.   Lifestyle Diet: going to get back into intermittent fasting. Trying to work on low carb.  Exercise: None exercise.      04/11/2022    7:54 AM  Depression screen PHQ 2/9  Decreased Interest 0  Down, Depressed, Hopeless 0  PHQ - 2 Score 0    Health Maintenance Due  Topic Date Due   HIV Screening  Never done   Hepatitis C Screening  Never done   Zoster Vaccines- Shingrix (1 of 2) Never done     ROS: Per HPI, otherwise a complete review of systems was negative.   PMH:  The following were reviewed and entered/updated in epic: Past Medical History:  Diagnosis Date   Allergy    Arthritis    Chronic kidney disease    Kidney Stone   Hypertension    Thyroid disease    Patient Active Problem List   Diagnosis Date Noted   Alopecia 04/11/2022   Rhinitis 04/11/2022   Seborrheic dermatitis 04/11/2021   Dyslipidemia 04/11/2020   Hand pain 04/10/2020  Hypothyroidism 08/05/2013   Hypertension 07/13/2012   Past Surgical History:  Procedure Laterality Date   ANTERIOR CRUCIATE LIGAMENT REPAIR  2000   basketball injury    Family History  Problem Relation Age of Onset   Breast cancer Mother    Heart disease Father    Heart attack Paternal Grandfather    Colon cancer Neg Hx    Esophageal cancer Neg Hx    Rectal cancer Neg Hx    Stomach cancer Neg Hx     Medications-  reviewed and updated Current Outpatient Medications  Medication Sig Dispense Refill   aspirin-acetaminophen-caffeine (EXCEDRIN MIGRAINE) 250-250-65 MG per tablet Take 1 tablet by mouth every 6 (six) hours as needed for pain (headache).     azelastine (ASTELIN) 0.1 % nasal spray Place 2 sprays into both nostrils 2 (two) times daily. 30 mL 12   Biotin 1000 MCG tablet Take 1,000 mcg by mouth daily.     ibuprofen (ADVIL) 200 MG tablet Take 200 mg by mouth every 6 (six) hours as needed.      irbesartan (AVAPRO) 300 MG tablet Take 1 tablet (300 mg total) by mouth daily. 90 tablet 3   ketoconazole (NIZORAL) 2 % shampoo Apply 1 application topically 2 (two) times a week. 120 mL 0   levothyroxine (SYNTHROID) 125 MCG tablet TAKE ONE TABLET BY MOUTH DAILY 90 tablet 3   No current facility-administered medications for this visit.    Allergies-reviewed and updated Allergies  Allergen Reactions   Penicillins Other (See Comments)    Family allergic pt does not take    Social History   Socioeconomic History   Marital status: Married    Spouse name: Not on file   Number of children: Not on file   Years of education: Not on file   Highest education level: Not on file  Occupational History   Not on file  Tobacco Use   Smoking status: Never   Smokeless tobacco: Never  Vaping Use   Vaping Use: Never used  Substance and Sexual Activity   Alcohol use: No   Drug use: No   Sexual activity: Yes  Other Topics Concern   Not on file  Social History Narrative   Not on file   Social Determinants of Health   Financial Resource Strain: Not on file  Food Insecurity: Not on file  Transportation Needs: Not on file  Physical Activity: Not on file  Stress: Not on file  Social Connections: Not on file        Objective:  Physical Exam: BP (!) 193/127   Pulse 66   Temp 98.2 F (36.8 C) (Temporal)   Resp 16   Ht 6\' 2"  (1.88 m)   Wt 212 lb 6.4 oz (96.3 kg)   SpO2 97%   BMI 27.27 kg/m   Body  mass index is 27.27 kg/m. Wt Readings from Last 3 Encounters:  04/11/22 212 lb 6.4 oz (96.3 kg)  11/08/21 206 lb 9.6 oz (93.7 kg)  04/11/21 214 lb (97.1 kg)   Gen: NAD, resting comfortably HEENT: TMs normal bilaterally. OP clear. No thyromegaly noted.  CV: RRR with no murmurs appreciated Pulm: NWOB, CTAB with no crackles, wheezes, or rhonchi GI: Normal bowel sounds present. Soft, Nontender, Nondistended. MSK: no edema, cyanosis, or clubbing noted Skin: warm, dry Neuro: CN2-12 grossly intact. Strength 5/5 in upper and lower extremities. Reflexes symmetric and intact bilaterally.  Psych: Normal affect and thought content     Nami Strawder M. 04/13/21, MD 04/11/2022  8:48 AM

## 2022-04-11 NOTE — Patient Instructions (Signed)
It was very nice to see you today!  Please start the irbesartan 300 mg daily.  Keep an eye on your blood pressure and follow-up with me in a couple weeks to let me know how it is looking.  Please try the Astelin to help with the nasal congestion.  I will refer you to see the dermatologist.  We will check blood work today.  Please continue to work on diet and exercise.  We will see back in year for your next physical.  Come back sooner if needed.  Take care, Dr Jimmey Ralph  PLEASE NOTE:  If you had any lab tests please let us know if you have not heard back within a few days. You may see your results on mychart before we have a chance to review them but we will give you a call once they are reviewed by [place]. If we ordered any referrals today, please let us know if you have not heard from their office within the next week.   Please try these tips to maintain a healthy lifestyle:  Eat at least 3 REAL meals and 1-2 snacks per day.  Aim for no more than 5 hours between eating.  If you eat breakfast, please do so within one hour of getting up.   Each meal should contain half fruits/vegetables, one quarter protein, and one quarter carbs (no bigger than a computer mouse)  Cut down on sweet beverages. This includes juice, soda, and sweet tea.   Drink at least 1 glass of water with each meal and aim for at least 8 glasses per day  Exercise at least 150 minutes every week.    Preventive Care 55-30 Years Old, Male Preventive care refers to lifestyle choices and visits with your health care provider that can promote health and wellness. Preventive care visits are also called wellness exams. What can I expect for my preventive care visit? Counseling During your preventive care visit, your health care provider may ask about your: Medical history, including: Past medical problems. Family medical history. Current health, including: Emotional well-being. Home life and relationship  well-being. Sexual activity. Lifestyle, including: Alcohol, nicotine or tobacco, and drug use. Access to firearms. Diet, exercise, and sleep habits. Safety issues such as seatbelt and bike helmet use. Sunscreen use. Work and work Astronomer. Physical exam Your health care provider will check your: Height and weight. These may be used to calculate your BMI (body mass index). BMI is a measurement that tells if you are at a healthy weight. Waist circumference. This measures the distance around your waistline. This measurement also tells if you are at a healthy weight and may help predict your risk of certain diseases, such as type 2 diabetes and high blood pressure. Heart rate and blood pressure. Body temperature. Skin for abnormal spots. What immunizations do I need?  Vaccines are usually given at various ages, according to a schedule. Your health care provider will recommend vaccines for you based on your age, medical history, and lifestyle or other factors, such as travel or where you work. What tests do I need? Screening Your health care provider may recommend screening tests for certain conditions. This may include: Lipid and cholesterol levels. Diabetes screening. This is done by checking your blood sugar (glucose) after you have not eaten for a while (fasting). Hepatitis B test. Hepatitis C test. HIV (human immunodeficiency virus) test. STI (sexually transmitted infection) testing, if you are at risk. Lung cancer screening. Prostate cancer screening. Colorectal cancer screening. Talk with  your health care provider about your test results, treatment options, and if necessary, the need for more tests. Follow these instructions at home: Eating and drinking  Eat a diet that includes fresh fruits and vegetables, whole grains, lean protein, and low-fat dairy products. Take vitamin and mineral supplements as recommended by your health care provider. Do not drink alcohol if your  health care provider tells you not to drink. If you drink alcohol: Limit how much you have to 0-2 drinks a day. Know how much alcohol is in your drink. In the [place] one drink equals one 12 oz bottle of beer (355 mL), one 5 oz glass of wine (148 mL), or one 1 oz glass of hard liquor (44 mL). Lifestyle Brush your teeth every morning and night with fluoride toothpaste. Floss one time each day. Exercise for at least 30 minutes 5 or more days each week. Do not use any products that contain nicotine or tobacco. These products include cigarettes, chewing tobacco, and vaping devices, such as e-cigarettes. If you need help quitting, ask your health care provider. Do not use drugs. If you are sexually active, practice safe sex. Use a condom or other form of protection to prevent STIs. Take aspirin only as told by your health care provider. Make sure that you understand how much to take and what form to take. Work with your health care provider to find out whether it is safe and beneficial for you to take aspirin daily. Find healthy ways to manage stress, such as: Meditation, yoga, or listening to music. Journaling. Talking to a trusted person. Spending time with friends and family. Minimize exposure to UV radiation to reduce your risk of skin cancer. Safety Always wear your seat belt while driving or riding in a vehicle. Do not drive: If you have been drinking alcohol. Do not ride with someone who has been drinking. When you are tired or distracted. While texting. If you have been using any mind-altering substances or drugs. Wear a helmet and other protective equipment during sports activities. If you have firearms in your house, make sure you follow all gun safety procedures. What's next? Go to your health care provider once a year for an annual wellness visit. Ask your health care provider how often you should have your eyes and teeth checked. Stay up to date on all vaccines. This information  is not intended to replace advice given to you by your health care provider. Make sure you discuss any questions you have with your health care provider. Document Revised: 12/05/2020 Document Reviewed: 12/05/2020 Elsevier Patient Education  [employer].

## 2022-04-11 NOTE — Assessment & Plan Note (Signed)
On levothyroxine 125 mg daily.  He recently did have a change in manufacturer.  We will check TSH today.  He is having some concurrent alopecia as well.

## 2022-04-11 NOTE — Assessment & Plan Note (Signed)
Possibly allergic rhinitis.  No red flags.  We will start Astelin nasal spray as needed.

## 2022-04-11 NOTE — Assessment & Plan Note (Addendum)
Significantly elevated today.  No symptoms and no signs of endorgan damage -no indication for emergent treatment at this point.  He has been under a lot of stress recently which he thinks is the main contributor.  He has been on blood pressure meds in the past and is willing to restart.  Well with irbesartan in the past.  We will start 300 mg daily.  Check labs today.  He will continue to monitor his blood pressure at home and check in with [place] in a couple weeks.

## 2022-04-11 NOTE — Assessment & Plan Note (Signed)
We will check labs today.  We did discussed medication options however he is not interested in treatment at this point.  Wants to make sure there is no underlying issue causing this.  We also discussed role of stress he will be working on stress reduction.

## 2022-04-17 ENCOUNTER — Encounter: Payer: No Typology Code available for payment source | Admitting: Family Medicine

## 2022-04-17 NOTE — Progress Notes (Signed)
Please inform patient of the following:  Cholesterol is up quite a bit since last time but everything else is stable.  Do not need to make any changes to his treatment plan at this time.  He should continue to work on diet and exercise and we can recheck in a year or so.

## 2022-04-29 ENCOUNTER — Other Ambulatory Visit: Payer: Self-pay | Admitting: Family Medicine

## 2023-01-14 ENCOUNTER — Ambulatory Visit: Payer: No Typology Code available for payment source | Admitting: Family Medicine

## 2023-01-14 ENCOUNTER — Encounter: Payer: Self-pay | Admitting: Family Medicine

## 2023-01-14 VITALS — BP 125/81 | HR 56 | Temp 97.8°F | Ht 74.0 in | Wt 202.0 lb

## 2023-01-14 DIAGNOSIS — I10 Essential (primary) hypertension: Secondary | ICD-10-CM

## 2023-01-14 DIAGNOSIS — F524 Premature ejaculation: Secondary | ICD-10-CM | POA: Diagnosis not present

## 2023-01-14 MED ORDER — IRBESARTAN 150 MG PO TABS: 300.0000 mg | ORAL_TABLET | Freq: Every day | ORAL | 5 refills | Status: DC

## 2023-01-14 MED ORDER — TADALAFIL 5 MG PO TABS: 5.0000 mg | ORAL_TABLET | Freq: Every day | ORAL | 11 refills | Status: DC | PRN

## 2023-01-14 NOTE — Assessment & Plan Note (Signed)
Blood pressure at goal today.  He is down about 10 pounds since her last visit.  He is working on diet and exercise and now has lower stress.  Will decrease dose of irbesartan to 150 mg daily.  He will continue to monitor at home and check in with [place] in a couple of weeks and we can titrate as tolerated.

## 2023-01-14 NOTE — Progress Notes (Signed)
   Steve Olson is a 57 y.o. male who presents today for an office visit.  Assessment/Plan:  Chronic Problems Addressed Today: Hypertension Blood pressure at goal today.  He is down about 10 pounds since her last visit.  He is working on diet and exercise and now has lower stress.  Will decrease dose of irbesartan to 150 mg daily.  He will continue to monitor at home and check in with [place] in a couple of weeks and we can titrate as tolerated.  Premature ejaculation No red flags.  We discussed treatment options.  We will start Cialis 5 mg daily.  We discussed potential side effects.  He will follow-up with me in a couple of weeks via MyChart and we can titrate dose as needed.     Subjective:  HPI:  See Assessment / plan for status of chronic conditions.  He is here today for BP follow up. He has been consistent with valsartan 300 mg. Recently lost his job which he does think has lowered his stress level.  He has also been making an effort to lift more healthy and is working on diet and exercise as well.  He has been checking his blood pressure at home for the last week or 2.  Home readings have been in the 90s over 50s to 100s over 50s.  Blood pressure this morning was 102/54.  He is interested in decreasing dose.  Is also having more issues with premature ejaculation.  He has previously discussed with this with urology.  They did discuss starting Cialis or Viagra however he deferred at that point now he is interested in starting however.       Objective:  Physical Exam: BP 125/81   Pulse (!) 56   Temp 97.8 F (36.6 C) (Temporal)   Ht 6\' 2"  (1.88 m)   Wt 202 lb (91.6 kg)   SpO2 100%   BMI 25.94 kg/m   Wt Readings from Last 3 Encounters:  01/14/23 202 lb (91.6 kg)  04/11/22 212 lb 6.4 oz (96.3 kg)  11/08/21 206 lb 9.6 oz (93.7 kg)    Gen: No acute distress, resting comfortably CV: Regular rate and rhythm with no murmurs appreciated Pulm: Normal work of breathing, clear to  auscultation bilaterally with no crackles, wheezes, or rhonchi Neuro: Grossly normal, moves all extremities Psych: Normal affect and thought content      Steve Olson M. Jimmey Ralph, MD 01/14/2023 10:23 AM

## 2023-01-14 NOTE — Assessment & Plan Note (Signed)
No red flags.  We discussed treatment options.  We will start Cialis 5 mg daily.  We discussed potential side effects.  He will follow-up with me in a couple of weeks via MyChart and we can titrate dose as needed.

## 2023-01-14 NOTE — Patient Instructions (Signed)
It was very nice to see you today!  Please decrease your irbesartan to 150 mg daily.  We will send in a prescription for this.  Will also start low-dose Cialis.  Sending message in a couple weeks to let me know how you are doing.  Return in about 2 weeks (around 01/28/2023).   Take care, Dr Jimmey Ralph  PLEASE NOTE:  If you had any lab tests, please let us know if you have not heard back within a few days. You may see your results on mychart before we have a chance to review them but we will give you a call once they are reviewed by [place].   If we ordered any referrals today, please let us know if you have not heard from their office within the next week.   If you had any urgent prescriptions sent in today, please check with the pharmacy within an hour of our visit to make sure the prescription was transmitted appropriately.   Please try these tips to maintain a healthy lifestyle:  Eat at least 3 REAL meals and 1-2 snacks per day.  Aim for no more than 5 hours between eating.  If you eat breakfast, please do so within one hour of getting up.   Each meal should contain half fruits/vegetables, one quarter protein, and one quarter carbs (no bigger than a computer mouse)  Cut down on sweet beverages. This includes juice, soda, and sweet tea.   Drink at least 1 glass of water with each meal and aim for at least 8 glasses per day  Exercise at least 150 minutes every week.

## 2023-03-04 ENCOUNTER — Other Ambulatory Visit: Payer: Self-pay | Admitting: *Deleted

## 2023-03-04 ENCOUNTER — Telehealth: Payer: Self-pay | Admitting: Family Medicine

## 2023-03-04 MED ORDER — IRBESARTAN 150 MG PO TABS: 150.0000 mg | ORAL_TABLET | Freq: Every day | ORAL | 5 refills | Status: DC

## 2023-03-04 NOTE — Telephone Encounter (Signed)
Pt states he needs a new RX stating  irbesartan (AVAPRO) 150 MG tablet  Take 1 a day instead of 2 a day. Please advise.

## 2023-03-04 NOTE — Telephone Encounter (Signed)
New Rx Avapro send to pharmacy  Per PCP note

## 2023-04-21 ENCOUNTER — Encounter: Payer: No Typology Code available for payment source | Admitting: Family Medicine

## 2023-04-26 ENCOUNTER — Other Ambulatory Visit: Payer: Self-pay | Admitting: Family Medicine

## 2023-06-22 ENCOUNTER — Encounter: Payer: Self-pay | Admitting: Family Medicine

## 2023-06-22 ENCOUNTER — Ambulatory Visit (INDEPENDENT_AMBULATORY_CARE_PROVIDER_SITE_OTHER): Payer: 59 | Admitting: Family Medicine

## 2023-06-22 VITALS — BP 119/80 | HR 56 | Temp 97.3°F | Ht 74.0 in | Wt 206.0 lb

## 2023-06-22 DIAGNOSIS — Z125 Encounter for screening for malignant neoplasm of prostate: Secondary | ICD-10-CM | POA: Diagnosis not present

## 2023-06-22 DIAGNOSIS — F439 Reaction to severe stress, unspecified: Secondary | ICD-10-CM | POA: Diagnosis not present

## 2023-06-22 DIAGNOSIS — R739 Hyperglycemia, unspecified: Secondary | ICD-10-CM | POA: Diagnosis not present

## 2023-06-22 DIAGNOSIS — E039 Hypothyroidism, unspecified: Secondary | ICD-10-CM | POA: Diagnosis not present

## 2023-06-22 DIAGNOSIS — I10 Essential (primary) hypertension: Secondary | ICD-10-CM

## 2023-06-22 DIAGNOSIS — E785 Hyperlipidemia, unspecified: Secondary | ICD-10-CM | POA: Diagnosis not present

## 2023-06-22 DIAGNOSIS — G4730 Sleep apnea, unspecified: Secondary | ICD-10-CM | POA: Diagnosis not present

## 2023-06-22 DIAGNOSIS — L219 Seborrheic dermatitis, unspecified: Secondary | ICD-10-CM | POA: Diagnosis not present

## 2023-06-22 DIAGNOSIS — G4733 Obstructive sleep apnea (adult) (pediatric): Secondary | ICD-10-CM | POA: Insufficient documentation

## 2023-06-22 DIAGNOSIS — F524 Premature ejaculation: Secondary | ICD-10-CM | POA: Diagnosis not present

## 2023-06-22 DIAGNOSIS — Z0001 Encounter for general adult medical examination with abnormal findings: Secondary | ICD-10-CM

## 2023-06-22 LAB — LIPID PANEL
Cholesterol: 201 mg/dL — ABNORMAL HIGH (ref 0–200)
HDL: 46.2 mg/dL (ref >39.00)
LDL Cholesterol: 132 mg/dL — ABNORMAL HIGH (ref 0–99)
NonHDL: 154.37
Total CHOL/HDL Ratio: 4
Triglycerides: 114 mg/dL (ref 0.0–149.0)
VLDL: 22.8 mg/dL (ref 0.0–40.0)

## 2023-06-22 LAB — CBC
HCT: 45.2 % (ref 39.0–52.0)
Hemoglobin: 15.1 g/dL (ref 13.0–17.0)
MCHC: 33.4 g/dL (ref 30.0–36.0)
MCV: 97.1 fL (ref 78.0–100.0)
Platelets: 299 10*3/uL (ref 150.0–400.0)
RBC: 4.65 Mil/uL (ref 4.22–5.81)
RDW: 13.2 % (ref 11.5–15.5)
WBC: 5.2 10*3/uL (ref 4.0–10.5)

## 2023-06-22 LAB — HEMOGLOBIN A1C: Hgb A1c MFr Bld: 5.7 % (ref 4.6–6.5)

## 2023-06-22 LAB — COMPREHENSIVE METABOLIC PANEL
ALT: 24 U/L (ref 0–53)
AST: 21 U/L (ref 0–37)
Albumin: 4.4 g/dL (ref 3.5–5.2)
Alkaline Phosphatase: 46 U/L (ref 39–117)
BUN: 20 mg/dL (ref 6–23)
CO2: 29 meq/L (ref 19–32)
Calcium: 9.3 mg/dL (ref 8.4–10.5)
Chloride: 104 meq/L (ref 96–112)
Creatinine, Ser: 1.15 mg/dL (ref 0.40–1.50)
GFR: 71.08 mL/min (ref >60.00)
Glucose, Bld: 106 mg/dL — ABNORMAL HIGH (ref 70–99)
Potassium: 4.5 meq/L (ref 3.5–5.1)
Sodium: 140 meq/L (ref 135–145)
Total Bilirubin: 0.9 mg/dL (ref 0.2–1.2)
Total Protein: 6.9 g/dL (ref 6.0–8.3)

## 2023-06-22 LAB — PSA: PSA: 0.61 ng/mL (ref 0.10–4.00)

## 2023-06-22 LAB — TSH: TSH: 7.3 u[IU]/mL — ABNORMAL HIGH (ref 0.35–5.50)

## 2023-06-22 MED ORDER — TADALAFIL 20 MG PO TABS
10.0000 mg | ORAL_TABLET | ORAL | 3 refills | Status: AC | PRN
Start: 2023-06-22 — End: ?

## 2023-06-22 NOTE — Assessment & Plan Note (Signed)
Blood pressure at goal today.  Has blood pressure at home has been at goal as well.  We did discuss changing dose of irbesartan however we will continue for now.  He will follow-up with [place] in a few months via MyChart and we can titrate as needed.  We discussed importance of regular exercise and low-sodium diet.

## 2023-06-22 NOTE — Assessment & Plan Note (Signed)
On levothyroxine 125 mcg daily.  Check TSH.

## 2023-06-22 NOTE — Assessment & Plan Note (Signed)
Did not have much improvement with Cialis.  We will go to 10 mg daily.  He can try increasing the dose to 20 mg daily if this dose is not effective.  He is aware of potential side effects.  If does not have any improvement with Cialis would consider sildenafil.

## 2023-06-22 NOTE — Assessment & Plan Note (Signed)
Will refer to dermatology

## 2023-06-22 NOTE — Assessment & Plan Note (Signed)
Check lipids. Discussed lifestyle modifications.  
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
## 2023-06-22 NOTE — Patient Instructions (Signed)
It was very nice to see you today!  We will check blood work today.  Please increase your Cialis to 10 mg daily.  You can increase to 20 mg daily if needed if the 10 mg daily dose does not work.  Please let us know if you have any side effects including lightheadedness or dizziness. Please continue monitor your blood pressure at home and let us know if you would like to decrease the dose.  I will refer you for sleep study.  Please continue to work on diet and exercise.  I will refer you to see the dermatologist.  Return in about 1 year (around 06/21/2024) for Annual Physical.   Take care, Dr Jimmey Ralph  PLEASE NOTE:  If you had any lab tests, please let us know if you have not heard back within a few days. You may see your results on mychart before we have a chance to review them but we will give you a call once they are reviewed by [place].   If we ordered any referrals today, please let us know if you have not heard from their office within the next week.   If you had any urgent prescriptions sent in today, please check with the pharmacy within an hour of our visit to make sure the prescription was transmitted appropriately.   Please try these tips to maintain a healthy lifestyle:  Eat at least 3 REAL meals and 1-2 snacks per day.  Aim for no more than 5 hours between eating.  If you eat breakfast, please do so within one hour of getting up.   Each meal should contain half fruits/vegetables, one quarter protein, and one quarter carbs (no bigger than a computer mouse)  Cut down on sweet beverages. This includes juice, soda, and sweet tea.   Drink at least 1 glass of water with each meal and aim for at least 8 glasses per day  Exercise at least 150 minutes every week.    Preventive Care 20-18 Years Old, Male Preventive care refers to lifestyle choices and visits with your health care provider that can promote health and wellness. Preventive care visits are also called wellness  exams. What can I expect for my preventive care visit? Counseling During your preventive care visit, your health care provider may ask about your: Medical history, including: Past medical problems. Family medical history. Current health, including: Emotional well-being. Home life and relationship well-being. Sexual activity. Lifestyle, including: Alcohol, nicotine or tobacco, and drug use. Access to firearms. Diet, exercise, and sleep habits. Safety issues such as seatbelt and bike helmet use. Sunscreen use. Work and work Astronomer. Physical exam Your health care provider will check your: Height and weight. These may be used to calculate your BMI (body mass index). BMI is a measurement that tells if you are at a healthy weight. Waist circumference. This measures the distance around your waistline. This measurement also tells if you are at a healthy weight and may help predict your risk of certain diseases, such as type 2 diabetes and high blood pressure. Heart rate and blood pressure. Body temperature. Skin for abnormal spots. What immunizations do I need?  Vaccines are usually given at various ages, according to a schedule. Your health care provider will recommend vaccines for you based on your age, medical history, and lifestyle or other factors, such as travel or where you work. What tests do I need? Screening Your health care provider may recommend screening tests for certain conditions. This may include: Lipid  and cholesterol levels. Diabetes screening. This is done by checking your blood sugar (glucose) after you have not eaten for a while (fasting). Hepatitis B test. Hepatitis C test. HIV (human immunodeficiency virus) test. STI (sexually transmitted infection) testing, if you are at risk. Lung cancer screening. Prostate cancer screening. Colorectal cancer screening. Talk with your health care provider about your test results, treatment options, and if necessary, the  need for more tests. Follow these instructions at home: Eating and drinking  Eat a diet that includes fresh fruits and vegetables, whole grains, lean protein, and low-fat dairy products. Take vitamin and mineral supplements as recommended by your health care provider. Do not drink alcohol if your health care provider tells you not to drink. If you drink alcohol: Limit how much you have to 0-2 drinks a day. Know how much alcohol is in your drink. In the [place] one drink equals one 12 oz bottle of beer (355 mL), one 5 oz glass of wine (148 mL), or one 1 oz glass of hard liquor (44 mL). Lifestyle Brush your teeth every morning and night with fluoride toothpaste. Floss one time each day. Exercise for at least 30 minutes 5 or more days each week. Do not use any products that contain nicotine or tobacco. These products include cigarettes, chewing tobacco, and vaping devices, such as e-cigarettes. If you need help quitting, ask your health care provider. Do not use drugs. If you are sexually active, practice safe sex. Use a condom or other form of protection to prevent STIs. Take aspirin only as told by your health care provider. Make sure that you understand how much to take and what form to take. Work with your health care provider to find out whether it is safe and beneficial for you to take aspirin daily. Find healthy ways to manage stress, such as: Meditation, yoga, or listening to music. Journaling. Talking to a trusted person. Spending time with friends and family. Minimize exposure to UV radiation to reduce your risk of skin cancer. Safety Always wear your seat belt while driving or riding in a vehicle. Do not drive: If you have been drinking alcohol. Do not ride with someone who has been drinking. When you are tired or distracted. While texting. If you have been using any mind-altering substances or drugs. Wear a helmet and other protective equipment during sports activities. If you  have firearms in your house, make sure you follow all gun safety procedures. What's next? Go to your health care provider once a year for an annual wellness visit. Ask your health care provider how often you should have your eyes and teeth checked. Stay up to date on all vaccines. This information is not intended to replace advice given to you by your health care provider. Make sure you discuss any questions you have with your health care provider. Document Revised: 12/05/2020 Document Reviewed: 12/05/2020 Elsevier Patient Education  2024 [employer].

## 2023-06-22 NOTE — Assessment & Plan Note (Signed)
Likely has some underlying OSA.  Will place referral for sleep study for further evaluation.

## 2023-06-25 ENCOUNTER — Other Ambulatory Visit: Payer: Self-pay | Admitting: *Deleted

## 2023-06-25 DIAGNOSIS — E039 Hypothyroidism, unspecified: Secondary | ICD-10-CM

## 2023-06-25 MED ORDER — LEVOTHYROXINE SODIUM 150 MCG PO TABS: 150.0000 ug | ORAL_TABLET | Freq: Every day | ORAL | 3 refills | Status: DC

## 2023-07-17 ENCOUNTER — Encounter: Payer: Self-pay | Admitting: Family Medicine

## 2023-07-20 NOTE — Telephone Encounter (Signed)
Send information to [place], Armed forces training and education officer

## 2023-07-22 ENCOUNTER — Ambulatory Visit: Payer: 59 | Admitting: Neurology

## 2023-07-22 ENCOUNTER — Encounter: Payer: Self-pay | Admitting: Neurology

## 2023-07-22 VITALS — BP 150/95 | HR 62 | Ht 73.5 in | Wt 208.8 lb

## 2023-07-22 DIAGNOSIS — R0683 Snoring: Secondary | ICD-10-CM | POA: Insufficient documentation

## 2023-07-22 DIAGNOSIS — G478 Other sleep disorders: Secondary | ICD-10-CM | POA: Insufficient documentation

## 2023-07-22 DIAGNOSIS — M2619 Other specified anomalies of jaw-cranial base relationship: Secondary | ICD-10-CM | POA: Diagnosis not present

## 2023-07-22 NOTE — Patient Instructions (Signed)
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     1) Snoring, witnessed apneas - non restorative sleep,  not EDS. Latent sleeper.   2) Steve Olson- [place] - [place] ancestry-  unclear if native [place] ancestry.  Has an overbite - this is a risk factor.    I plan to perform a HST to screen for sleep apnea.   I plan to follow up either personally or through our NP within 3-4 months.   I would like to thank Ardith Dark, MD and Ardith Dark, Md [address] [place],  [place] [address] for allowing me to meet with and to take care of this pleasant patient.     After spending a total time of  45  minutes face to face and additional time for physical and neurologic examination, review of laboratory studies,  personal review of imaging studies, reports and results of other testing and review of referral information / records as far as provided in visit,   Electronically signed by: Melvyn Novas, MD 07/22/2023 9:34 AM  [facility] Board certified by The [employer] of Sleep Medicine and Diplomate of the [employer] of [employer] Medicine. Board certified In Neurology through the ABPN, Fellow of the [employer] of [employer].

## 2023-07-22 NOTE — Addendum Note (Signed)
Addended by: Melvyn Novas on: 07/22/2023 10:12 AM   Modules accepted: Orders

## 2023-07-22 NOTE — Progress Notes (Signed)
SLEEP MEDICINE CLINIC    Provider:  Melvyn Novas, MD  Primary Care Physician:  Ardith Dark, MD [address] 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  Chief concern according to patient :  " I have some word-finding problems and delays, and wonder if its sleep related. I don't feel fully restored". Snoring  was milder , but now is back to loud.  He falls asleep often with the Tv on, 9-12 PM and then in bed until 4 AM. I just don't feel I sleep well. Sometimes in a recliner, sometime  even if I go to sleep later, I still wake up at  3-4 AM".  Exception : when physically active I may sleep a bit longer.  Golf partners have witnessed apnea.  My job changed and I used to struggle to stay awake in in-person or  zoom meetings, but I can watch something on my computer for hours. Stimulation dependent.     I have the pleasure of seeing Steve Olson 07/22/23 a right-handed Caucasian  male with a possibly organic sleep disorder.    Sleep relevant medical history: Nocturia/ 1-2 times, No ENT surgeries,overbite ,.    Family medical /sleep history: brother with OSA, brother and mother are overweight.  Mother with migraines.    Social history: Patient is working as a Clinical research associate for [employer] , and lives in a household with spouse  with 5 children. Father in law lives with the patient's core family.   2 out of 5 children are still at home. No pets.  The patient currently works full time.   Tobacco use: none .   ETOH use ; never , Caffeine intake in form of Coffee( 2 cups a day) Soda( coke  on [employer] ) Tea ( herbal /) or energy drinks Exercise in form  of golf .         Sleep habits are as follows: The patient's dinner time is between 6-8 PM. The patient goes to bed at 9-10 PM, often after a nap on the couch,  and continues to sleep for 5-7  hours, wakes for 2 bathroom breaks.   The preferred sleep position is variable , with the support of 1 pillow  and a body pillow.  are reportedly infrequent  The patient wakes up spontaneously 4 , never used  an alarm. 4.30 AM -5  AM is the usual rise time. He  reports not feeling refreshed or restored in AM, with symptoms such as dry mouth, rare morning headaches, and residual fatigue.  Naps are taken infrequently, no naps planned- but can last from 15 to 30 minutes and are refreshing..    Review of Systems: Out of a complete 14 system review, the patient complains of only the following symptoms, and all other reviewed systems are negative.:  Fatigue, sleepiness , snoring, fragmented sleep, Nocturia    How likely are you to doze in the following situations: 0 = not likely, 1 = slight chance, 2 = moderate chance, 3 =  high chance   Sitting and Reading? Watching Television? Sitting inactive in a public place (theater or meeting)? As a passenger in a car for an hour without a break? Lying down in the afternoon when circumstances permit? Sitting and talking to someone? Sitting quietly after lunch without alcohol? In a car, while stopped for a few minutes in traffic?   Total = 10/ 24 points   FSS endorsed at 14/ 63 points.   Social History   Socioeconomic History   Marital status: Married    Spouse name: Not on file   Number of children: 5   Years of education: Not on file   Highest education level: Not on file  Occupational History   Not on file  Tobacco Use   Smoking status: Never   Smokeless tobacco: Never  Vaping Use   Vaping status: Never Used  Substance and Sexual Activity   Alcohol use: No   Drug use: No   Sexual activity: Yes  Other Topics Concern   Not on file  Social  History Narrative   Right handed    Wears glasses    Drinks hot and cold tea and coke zero    Social Drivers of Corporate investment banker Strain: Not on file  Food Insecurity: Not on file  Transportation Needs: Not on file  Physical Activity: Not on file  Stress: Not on file  Social Connections: Not on file    Family History  Problem Relation Age of Onset   Breast cancer Mother    Heart disease Father    Heart attack Paternal Grandfather    Colon cancer Neg Hx    Esophageal cancer Neg Hx    Rectal cancer Neg Hx    Stomach cancer Neg Hx     Past Medical History:  Diagnosis Date   Allergy    Arthritis    Chronic kidney disease    Kidney Stone   Hypertension    Thyroid disease     Past Surgical History:  Procedure Laterality Date   ANTERIOR CRUCIATE LIGAMENT REPAIR  2000   basketball injury     Current Outpatient Medications on File Prior to Visit  Medication Sig Dispense Refill   irbesartan (AVAPRO) 150 MG tablet Take 1 tablet (150 mg total) by mouth daily. 30 tablet 5   levothyroxine (SYNTHROID) 150 MCG tablet Take 1 tablet (150 mcg total) by mouth daily. 90 tablet 3   tadalafil (CIALIS) 20 MG tablet Take 0.5-1 tablets (10-20 mg total) by mouth every other day as needed for erectile dysfunction. 30 tablet 3   No current facility-administered medications on file prior to visit.    Allergies  Allergen Reactions   Penicillins Other (See Comments)    Family allergic pt does not take     DIAGNOSTIC DATA (LABS, IMAGING, TESTING) - I reviewed patient records, labs, notes, testing and imaging myself where available.  Lab Results  Component Value Date   WBC 5.2 06/22/2023   HGB 15.1 06/22/2023   HCT 45.2 06/22/2023   MCV 97.1 06/22/2023   PLT 299.0 06/22/2023      Component Value Date/Time   NA 140 06/22/2023 0947   K 4.5 06/22/2023 0947   CL 104 06/22/2023 0947   CO2 29 06/22/2023 0947   GLUCOSE 106 (H) 06/22/2023 0947   BUN 20 06/22/2023 0947    CREATININE 1.15 06/22/2023 0947   CREATININE 1.18 04/10/2020 1144   CALCIUM 9.3 06/22/2023 0947   PROT 6.9 06/22/2023 0947  ALBUMIN 4.4 06/22/2023 0947   AST 21 06/22/2023 0947   ALT 24 06/22/2023 0947   ALKPHOS 46 06/22/2023 0947   BILITOT 0.9 06/22/2023 0947   GFRNONAA 62 (L) 08/18/2012 2042   GFRAA 72 (L) 08/18/2012 2042   Lab Results  Component Value Date   CHOL 201 (H) 06/22/2023   HDL 46.20 06/22/2023   LDLCALC 132 (H) 06/22/2023   TRIG 114.0 06/22/2023   CHOLHDL 4 06/22/2023   Lab Results  Component Value Date   HGBA1C 5.7 06/22/2023   No results found for: "VITAMINB12" Lab Results  Component Value Date   TSH 7.30 (H) 06/22/2023    PHYSICAL EXAM:  Today's Vitals   07/22/23 0840 07/22/23 0850  BP: (!) 154/94 (!) 150/95  Pulse: 62   Weight: 208 lb 12.8 oz (94.7 kg)   Height: 6' 1.5" (1.867 m)    Body mass index is 27.17 kg/m.   Wt Readings from Last 3 Encounters:  07/22/23 208 lb 12.8 oz (94.7 kg)  06/22/23 206 lb (93.4 kg)  01/14/23 202 lb (91.6 kg)     Ht Readings from Last 3 Encounters:  07/22/23 6' 1.5" (1.867 m)  06/22/23 6\' 2"  (1.88 m)  01/14/23 6\' 2"  (1.88 m)      General: The patient is awake, alert and appears not in acute distress. The patient is well groomed. Head: Normocephalic, atraumatic. Neck is supple. Mallampati 3 plus ,  neck circumference:17 inches . Nasal airflow not fully  patent.   Retrognathia is seen.  Overbite  Dental status:  biological  Cardiovascular:  Regular rate and cardiac rhythm by pulse,  without distended neck veins. Respiratory: Lungs are clear to auscultation.  Skin:  Without evidence of ankle edema, or rash. Trunk: The patient's posture is erect.   NEUROLOGIC EXAM: The patient is awake and alert, oriented to place and time.   Memory subjective described as intact.  Attention span & concentration ability appears normal.  Speech is fluent,  without  dysarthria, dysphonia or aphasia.  Mood and affect are  appropriate.   Cranial nerves: no loss of smell or taste reported  Pupils are equal and briskly reactive to light. Funduscopic exam deferred .  Extraocular movements in vertical and horizontal planes were intact and without nystagmus. No Diplopia. Visual fields by finger perimetry are intact. Hearing was intact to soft voice and finger rubbing.    Facial sensation intact to fine touch.  Facial motor strength is symmetric and tongue and uvula move midline.  Neck ROM : rotation, tilt and flexion extension were normal for age and shoulder shrug was symmetrical.    Motor exam:  Symmetric bulk, tone and ROM.   Normal tone without cog -wheeling, symmetric grip strength .   Sensory:  Fine touch, pinprick and vibration were tested  and  normal.  Proprioception tested in the upper extremities was normal.   Coordination: Rapid alternating movements in the fingers/hands were of normal speed.  The Finger-to-nose maneuver was intact without evidence of ataxia, dysmetria or tremor.   Gait and station: Patient could rise unassisted from a seated position, walked without assistive device.  Stance is of normal width/ base and the patient turned with 3 steps.  Toe and heel walk were deferred.  Deep tendon reflexes: in the  upper and lower extremities are symmetric and intact.  Babinski response was deferred .    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     1) Snoring, witnessed apneas - non restorative sleep,  not EDS. Latent sleeper.   2) Lynnell Chad- [place] - [place] ancestry-  unclear if native [place] ancestry.  Has an overbite - this is a risk factor.    I plan to perform a HST to screen for sleep apnea.   I plan to follow up either personally or through our NP within 3-4 months.   I would like to thank Ardith Dark, MD and Ardith Dark, Md [address] [place],  [place] [address] for allowing me to meet  with and to take care of this pleasant patient.     After spending a total time of  45  minutes face to face and additional time for physical and neurologic examination, review of laboratory studies,  personal review of imaging studies, reports and results of other testing and review of referral information / records as far as provided in visit,   Electronically signed by: Melvyn Novas, MD 07/22/2023 9:34 AM  [facility] Board certified by The [employer] of Sleep Medicine and Diplomate of the [employer] of Sleep Medicine. Board certified In Neurology through the ABPN, Fellow of the [employer] of Neurology.

## 2023-07-28 ENCOUNTER — Telehealth: Payer: Self-pay | Admitting: *Deleted

## 2023-07-28 ENCOUNTER — Other Ambulatory Visit: Payer: Self-pay | Admitting: *Deleted

## 2023-07-28 MED ORDER — IRBESARTAN 150 MG PO TABS: 150.0000 mg | ORAL_TABLET | Freq: Every day | ORAL | 1 refills | Status: DC

## 2023-07-28 NOTE — Telephone Encounter (Signed)
Patient aware will let us know if BP over 140/90 Rx refill send to pharmacy

## 2023-07-28 NOTE — Telephone Encounter (Signed)
 Copied from CRM [id number]. Topic: Clinical - Medication Question >> Jul 28, 2023  9:45 AM Graeme ORN wrote: Reason for CRM: Patient called about medication concern. Is currently out of irbesartan  (AVAPRO ) 150 MG tablet since yesterday but states dose was recently lowered and he was at an appt for a referral and blood pressure was reading high at 150/90. Patient does not check daily. Patient wanted to know if provider wants him to go back to higher does. States he has gained some weight back, Would like to know what provider suggest and if he can order new refill. Stated he should have two left but pharmacy has not contacted him about filling. Thank You.   Please advise  Emmanuell Kantz,RMA

## 2023-07-28 NOTE — Telephone Encounter (Signed)
He can continue to monitor blood pressure for now.  If persistently elevated above 140/90 then we can increase the dose.

## 2023-08-21 ENCOUNTER — Ambulatory Visit: Payer: 59 | Admitting: Neurology

## 2023-08-21 DIAGNOSIS — G4733 Obstructive sleep apnea (adult) (pediatric): Secondary | ICD-10-CM | POA: Diagnosis not present

## 2023-08-21 DIAGNOSIS — G478 Other sleep disorders: Secondary | ICD-10-CM

## 2023-08-21 DIAGNOSIS — R0683 Snoring: Secondary | ICD-10-CM

## 2023-08-21 DIAGNOSIS — M2619 Other specified anomalies of jaw-cranial base relationship: Secondary | ICD-10-CM

## 2023-09-10 NOTE — Progress Notes (Signed)
 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   wakes up spontaneously at $ AM, no need for alarm clock, sleeps usually between 5-7 hours.. Brother with OSA,      Epworth sleepiness score: 10/ 24 points . FSS endorsed at 14/ 63 points.    BMI: 27 kg/m   Neck Circumference: 17"  with overbite jaw.    FINDINGS:   Sleep Summary:   Total Recording Time (hours, min):   9 hours 11 minutes  Total Sleep Time (hours, min):     7 hours 22 minutes with a sleep efficiency of 80%. The study started on 27 August 2023 at 8:57 PM.                                                Respiratory Indices:   Calculated pAHI (per hour) following AASM criteria:     18.4/h                                          Positional AHI:      The patient slept in supine and right lateral position, with the majority of sleep recorded in supine position.  Supine sleep showed a higher apnea-hypopnea index than nonsupine sleep.                                             Oxygen Saturation Statistics:   Oxygen Saturation (%) Mean:     96.9%         O2 Saturation Range (%):    Between a minimum saturation at 70% and a maximum saturation of 100%                                   O2 Saturation (minutes) <89%:     5 minutes      Pulse Rate Statistics:   Pulse Mean (bpm):        Heart rate during sleep varied between 50 bpm and 98 bpm with a mean heart rate of 70 bpm.     The home sleep test device sensor also gives information about cardiac rhythm and this patient was in normal sinus rhythm.                IMPRESSION:  This HST confirms the presence of mild to moderate obstructive sleep apnea with a brief overall desaturation time but a clinically relevant nadir of oxygen at 69.9%.   Snoring was present.     RECOMMENDATION:  [name] had expressed interest in non- CPAP options of apnea treatment.  I recommend  to use positive airway pressure therapy for obstructive sleep apnea as a first-line treatment.  ( This would be using an autotitration device between 5-`5 cm water, 2 cm EPR, heated humidification.  Mask to be fitted in supine or in reclined position).   The patient should avoid supine sleep/.  This patient also has an  significant overbite and it is possible to treat obstructive sleep apnea that is not REM sleep dependent with a dental device.   The achievable reduction in apnea hypopnea index is usually 50% sometimes 60. With positive airway pressure on the other hand there is a reduction of 95% possible. [name] spouse had witnessed apneas but he has not displayed excessive daytime sleepiness.   I will allow him to choose between both options.      NTERPRETING PHYSICIAN:   Melvyn Novas, MD  [facility] Sleep Board certified by The [employer] of Sleep Medicine and Diplomate of the [employer] of Sleep Medicine. Board certified In Neurology through the ABPN, Fellow of the [employer] of Neurology.

## 2023-09-16 ENCOUNTER — Encounter: Payer: Self-pay | Admitting: Neurology

## 2023-09-16 NOTE — Procedures (Signed)
 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   wakes up spontaneously at $ AM, no need for alarm clock, sleeps usually between 5-7 hours.. Brother with OSA,      Epworth sleepiness score: 10/ 24 points . FSS endorsed at 14/ 63 points.    BMI: 27 kg/m   Neck Circumference: 17"  with overbite jaw.    FINDINGS:   Sleep Summary:   Total Recording Time (hours, min):   9 hours 11 minutes  Total Sleep Time (hours, min):     7 hours 22 minutes with a sleep efficiency of 80%. The study started on 27 August 2023 at 8:57 PM.                                                Respiratory Indices:   Calculated pAHI (per hour) following AASM criteria:     18.4/h                                          Positional AHI:      The patient slept in supine and right lateral position, with the majority of sleep recorded in supine position.  Supine sleep showed a higher apnea-hypopnea index than nonsupine sleep.                                             Oxygen Saturation Statistics:   Oxygen Saturation (%) Mean:     96.9%         O2 Saturation Range (%):    Between a minimum saturation at 70% and a maximum saturation of 100%                                   O2 Saturation (minutes) <89%:     5 minutes      Pulse Rate Statistics:   Pulse Mean (bpm):        Heart rate during sleep varied between 50 bpm and 98 bpm with a mean heart rate of 70 bpm.     The home sleep test device sensor also gives information about cardiac rhythm and this patient was in normal sinus rhythm.                IMPRESSION:  This HST confirms the presence of mild to moderate obstructive sleep apnea with a brief overall desaturation time but a clinically relevant nadir of oxygen at 69.9%.   Snoring was present.     RECOMMENDATION:  [name] had expressed interest in non- CPAP options of apnea treatment.  I recommend  to use positive airway pressure therapy for obstructive sleep apnea as a first-line treatment.  ( This would be using an autotitration device between 5-`5 cm water, 2 cm EPR, heated humidification.  Mask to be fitted in supine or in reclined position).   The patient should avoid supine sleep/.  This patient also has an  significant overbite and it is possible to treat obstructive sleep apnea that is not REM sleep dependent with a dental device.   The achievable reduction in apnea hypopnea index is usually 50% sometimes 60. With positive airway pressure on the other hand there is a reduction of 95% possible. [name] spouse had witnessed apneas but he has not displayed excessive daytime sleepiness.   I will allow him to choose between both options.      NTERPRETING PHYSICIAN:   Melvyn Novas, MD  [facility] Sleep Board certified by The [employer] of Sleep Medicine and Diplomate of the [employer] of Sleep Medicine. Board certified In Neurology through the ABPN, Fellow of the [employer] of Neurology.

## 2023-09-17 ENCOUNTER — Telehealth: Payer: Self-pay | Admitting: Neurology

## 2023-09-17 DIAGNOSIS — R0683 Snoring: Secondary | ICD-10-CM

## 2023-09-17 DIAGNOSIS — M2619 Other specified anomalies of jaw-cranial base relationship: Secondary | ICD-10-CM

## 2023-09-17 DIAGNOSIS — G478 Other sleep disorders: Secondary | ICD-10-CM

## 2023-09-17 NOTE — Telephone Encounter (Signed)
-----   Message from [place] Dohmeier sent at 09/16/2023  1:41 PM EDT ----- The patient stated that he rpefers avoiding CPAP and rather would use a dental device. Referral to dr Irene Limbo oor Dr Evangeline Gula is Ok with me.  Medically he has a choice but  a dental device is not as effective in reduction apnea as a CPAP  or bBPAP device. I gave  instructions on what pressure CPAP he could use if he would try CPAP.  I leave the decision to the patient ( OSA is mild- moderate apnea , no long time hypoxia and  no bradycardia ).

## 2023-09-17 NOTE — Telephone Encounter (Signed)
 Referral form for dentistry for [facility] has been put in her tray for her to fillout sign date.

## 2023-09-17 NOTE — Telephone Encounter (Signed)
 I called pt. I advised pt that Dr. Vickey Huger reviewed their sleep study results and found that pt has mild to moderate osa. Dr. Vickey Huger recommends that pt starts auto CPAP or a referral to dentist for a dental device. Pt would like to do a dental device. Informed a referral for dentist would be placed. Instructed the patient that he should continue to avoid sleeping on his back and working on weight loss to help with reducing his apnea. Pt verbalized understanding. Pt had no questions at this time but was encouraged to call back if questions arise.

## 2023-09-21 NOTE — Telephone Encounter (Signed)
 Referral for dentistry faxed by Baird Lyons, RN to [facility]. Phone: [phone], Fax: [phone]

## 2023-09-30 ENCOUNTER — Other Ambulatory Visit: Payer: Self-pay | Admitting: Family Medicine

## 2023-09-30 NOTE — Telephone Encounter (Signed)
 Copied from CRM [id number]. Topic: Clinical - Medication Refill >> Sep 30, 2023  4:28 PM [place] E wrote: Most Recent Primary Care Visit:  Provider: Ardith Dark  Department: [facility]  Visit Type: PHYSICAL  Date: 06/22/2023  Medication: irbesartan (AVAPRO) 150 MG tablet  Has the patient contacted their pharmacy? Yes (Agent: If no, request that the patient contact the pharmacy for the refill. If patient does not wish to contact the pharmacy document the reason why and proceed with request.) (Agent: If yes, when and what did the pharmacy advise?)  Is this the correct pharmacy for this prescription? Yes If no, delete pharmacy and type the correct one.  This is the patient's preferred pharmacy:  [facility] [id number] [place], [place] - [address] [place] [address] Phone: [phone] Fax: [phone]   Has the prescription been filled recently? No  Is the patient out of the medication? Yes  Has the patient been seen for an appointment in the last year OR does the patient have an upcoming appointment? Yes  Can we respond through MyChart? Yes  Agent: Please be advised that Rx refills may take up to 3 business days. We ask that you follow-up with your pharmacy.

## 2023-10-01 MED ORDER — IRBESARTAN 150 MG PO TABS
150.0000 mg | ORAL_TABLET | Freq: Every day | ORAL | 1 refills | Status: DC
Start: 2023-10-01 — End: 2023-12-03

## 2023-12-03 ENCOUNTER — Other Ambulatory Visit: Payer: Self-pay | Admitting: Family Medicine

## 2023-12-03 MED ORDER — IRBESARTAN 150 MG PO TABS: 150.0000 mg | ORAL_TABLET | Freq: Every day | ORAL | 1 refills | Status: DC

## 2023-12-03 NOTE — Telephone Encounter (Signed)
 Copied from CRM [id number]. Topic: Clinical - Medication Refill >> Dec 03, 2023  9:11 AM Allyne Areola wrote: Medication: irbesartan  (AVAPRO ) 150 MG tablet [[id number]]  Has the patient contacted their pharmacy? No (Agent: If no, request that the patient contact the pharmacy for the refill. If patient does not wish to contact the pharmacy document the reason why and proceed with request.) (Agent: If yes, when and what did the pharmacy advise?)  This is the patient's preferred pharmacy:  [facility] [id number] [place], [place] - [address] [place] [address] Phone: [phone] Fax: [phone]  Is this the correct pharmacy for this prescription? Yes If no, delete pharmacy and type the correct one.   Has the prescription been filled recently? No  Is the patient out of the medication? Yes  Has the patient been seen for an appointment in the last year OR does the patient have an upcoming appointment? Yes  Can we respond through MyChart? Yes  Agent: Please be advised that Rx refills may take up to 3 business days. We ask that you follow-up with your pharmacy.

## 2024-02-15 NOTE — Telephone Encounter (Signed)
 Receive fax from [facility]  stating that Pt has started [employer] therpy sor snoring/OSA

## 2024-02-17 ENCOUNTER — Ambulatory Visit: Admitting: Physician Assistant

## 2024-02-17 ENCOUNTER — Encounter: Payer: Self-pay | Admitting: Physician Assistant

## 2024-02-17 ENCOUNTER — Ambulatory Visit: Payer: Self-pay

## 2024-02-17 VITALS — BP 118/80 | HR 75 | Temp 98.6°F | Ht 73.5 in | Wt 205.0 lb

## 2024-02-17 DIAGNOSIS — J029 Acute pharyngitis, unspecified: Secondary | ICD-10-CM

## 2024-02-17 LAB — POCT RAPID STREP A (OFFICE): Rapid Strep A Screen: NEGATIVE

## 2024-02-17 MED ORDER — BENZONATATE 100 MG PO CAPS: 100.0000 mg | ORAL_CAPSULE | Freq: Three times a day (TID) | ORAL | 1 refills | Status: DC | PRN

## 2024-02-17 NOTE — Patient Instructions (Signed)
 Upper respiratory infection recommendations for those with current or history of elevated blood pressure: 1. Avoid all over-the-counter antihistamines except Claritin/Loratadine and Zyrtec/Cetrizine. 2. Avoid all combination including cold sinus allergies flu decongestant and sleep medications 3. You can use Robitussin DM Mucinex and Mucinex DM for cough.  4. Use tessalon  perles as needed during the day for cough (keep away from children - can be toxic!). If tessalon  perles ineffective, trial 12-hour delsym over the counter cough syrup (generic is fine!)

## 2024-02-17 NOTE — Telephone Encounter (Signed)
 Appt today

## 2024-02-17 NOTE — Telephone Encounter (Signed)
 FYI Only or Action Required?: FYI only for provider.  Patient was last seen in primary care on 06/22/2023 by Kennyth Worth HERO, MD.  Called Nurse Triage reporting No chief complaint on file..  Symptoms began several days ago.  Interventions attempted: Nothing.  Symptoms are: gradually worsening.  Triage Disposition: See PCP When Office is Open (Within 3 Days)  Patient/caregiver understands and will follow disposition?: Yes    Copied from CRM [id number]. Topic: Clinical - Red Word Triage >> Feb 17, 2024  8:57 AM Gennette ORN wrote: Red Word that prompted transfer to Nurse Triage: Patient is having a severe cough and sore throat. He lost his voice Saturday evening. The cough started Sunday evening. Reason for Disposition  [1] Sore throat with cough/cold symptoms AND [2] present > 5 days  Answer Assessment - Initial Assessment Questions 1. ONSET: When did the throat start hurting? (Hours or days ago)      Since Saturday  2. SEVERITY: How bad is the sore throat? (Scale 1-10; mild, moderate or severe)     Mild   3. STREP EXPOSURE: Has there been any exposure to strep within the past week? If Yes, ask: What type of contact occurred?      Wife, Since Last Wednesday, Lives with  4.  VIRAL SYMPTOMS: Are there any symptoms of a cold, such as a runny nose, cough, hoarse voice or red eyes?      Constant Cough  5. FEVER: Do you have a fever? If Yes, ask: What is your temperature, how was it measured, and when did it start?     No  6. PUS ON THE TONSILS: Is there pus on the tonsils in the back of your throat?     Unsure  7. OTHER SYMPTOMS: Do you have any other symptoms? (e.g., difficulty breathing, headache, rash)     Runny Nose  Protocols used: Sore Throat-A-AH

## 2024-02-17 NOTE — Progress Notes (Signed)
 Steve Olson is a 58 y.o. male here for a follow up of a pre-existing problem.  History of Present Illness:   Chief Complaint  Patient presents with   Sore Throat    Pt c/o sore throat and congestion, started Saturday night. Wife went to doctor on Monday and was told bacterial infection.     Discussed the use of AI scribe software for clinical note transcription with the patient, who gave verbal consent to proceed.  History of Present Illness   Steve Olson is a 58 year old male who presents with cough and sore throat.  Symptoms began with a loss of voice on Saturday night, followed by cough, fever, chills, and diarrhea by Sunday evening. He has taken one dose of Tylenol  for throat pain and sleep. His cough is productive and worsened last night, affecting sleep. Throat pain is consistent, and he experiences ear pressure when drinking water without significant ear pain. He denies recent travel and works from home. His children are not sick. He does not take allergy medications and has no known allergies. His wife has similar symptom(s).       Past Medical History:  Diagnosis Date   Allergy    Arthritis    Chronic kidney disease    Kidney Stone   Hypertension    Thyroid  disease      Social History   Tobacco Use   Smoking status: Never   Smokeless tobacco: Never  Vaping Use   Vaping status: Never Used  Substance Use Topics   Alcohol use: No   Drug use: No    Past Surgical History:  Procedure Laterality Date   ANTERIOR CRUCIATE LIGAMENT REPAIR  2000   basketball injury    Family History  Problem Relation Age of Onset   Breast cancer Mother    Heart disease Father    Heart attack Paternal Grandfather    Colon cancer Neg Hx    Esophageal cancer Neg Hx    Rectal cancer Neg Hx    Stomach cancer Neg Hx     Allergies  Allergen Reactions   Penicillins Other (See Comments)    Family allergic pt does not take    Current Medications:   Current Outpatient Medications:     irbesartan  (AVAPRO ) 150 MG tablet, Take 1 tablet (150 mg total) by mouth daily., Disp: 30 tablet, Rfl: 1   levothyroxine  (SYNTHROID ) 150 MCG tablet, Take 1 tablet (150 mcg total) by mouth daily., Disp: 90 tablet, Rfl: 3   tadalafil  (CIALIS ) 20 MG tablet, Take 0.5-1 tablets (10-20 mg total) by mouth every other day as needed for erectile dysfunction., Disp: 30 tablet, Rfl: 3   Review of Systems:   Negative unless otherwise specified per HPI.  Vitals:   Vitals:   02/17/24 1129  BP: 118/80  Pulse: 75  Temp: 98.6 F (37 C)  TempSrc: Temporal  SpO2: 96%  Weight: 205 lb (93 kg)  Height: 6' 1.5 (1.867 m)     Body mass index is 26.68 kg/m.  Physical Exam:   Physical Exam Vitals and nursing note reviewed.  Constitutional:      General: He is not in acute distress.    Appearance: He is well-developed. He is not ill-appearing or toxic-appearing.  HENT:     Head: Normocephalic and atraumatic.     Right Ear: Tympanic membrane, ear canal and external ear normal. Tympanic membrane is not erythematous, retracted or bulging.     Left Ear: Tympanic membrane, ear canal and  external ear normal. Tympanic membrane is not erythematous, retracted or bulging.     Nose: Nose normal.     Right Sinus: No maxillary sinus tenderness or frontal sinus tenderness.     Left Sinus: No maxillary sinus tenderness or frontal sinus tenderness.     Mouth/Throat:     Pharynx: Uvula midline. Posterior oropharyngeal erythema present.  Eyes:     General: Lids are normal.     Conjunctiva/sclera: Conjunctivae normal.  Neck:     Trachea: Trachea normal.  Cardiovascular:     Rate and Rhythm: Normal rate and regular rhythm.     Heart sounds: Normal heart sounds, S1 normal and S2 normal.  Pulmonary:     Effort: Pulmonary effort is normal.     Breath sounds: Normal breath sounds. No decreased breath sounds, wheezing, rhonchi or rales.  Lymphadenopathy:     Cervical: No cervical adenopathy.  Skin:    General:  Skin is warm and dry.  Neurological:     Mental Status: He is alert.  Psychiatric:        Speech: Speech normal.        Behavior: Behavior normal. Behavior is cooperative.    Results for orders placed or performed in visit on 02/17/24  POCT rapid strep A  Result Value Ref Range   Rapid Strep A Screen Negative Negative     Assessment and Plan:   Assessment and Plan    Acute upper respiratory infection Likely viral etiology. Symptoms declining, indicating recovery. Negative strep test. Antibiotics not indicated. - Prescribe benzonatate  for cough suppression. - Recommend over-the-counter dextromethorphan for cough. - Advise ibuprofen for throat pain with food for a few days. - Consider antibiotics if symptoms persist or worsen after 7-10 days.      Lucie Buttner, PA-C

## 2024-02-19 ENCOUNTER — Telehealth: Payer: Self-pay | Admitting: *Deleted

## 2024-02-19 ENCOUNTER — Other Ambulatory Visit: Payer: Self-pay | Admitting: Physician Assistant

## 2024-02-19 MED ORDER — AZITHROMYCIN 250 MG PO TABS: ORAL_TABLET | ORAL | 0 refills | Status: AC

## 2024-02-19 NOTE — Telephone Encounter (Signed)
 Spoke to pt told him Samantha sent a Z-pack to the pharmacy for him. Pt verbalized understanding.

## 2024-02-19 NOTE — Telephone Encounter (Signed)
 Copied from CRM #[id number]. Topic: Clinical - Medication Question >> Feb 19, 2024  8:36 AM Revonda D wrote: Reason for CRM: Pt stated that he recently seen [facility] for a virus and was told to call back if he doesn't get any better. Pt would like to be prescribed an antibiotic or a steroid medication and would like a callback with an update.   See note  Elora KRAFT

## 2024-02-19 NOTE — Telephone Encounter (Signed)
 Error

## 2024-04-19 ENCOUNTER — Other Ambulatory Visit: Payer: Self-pay | Admitting: Family Medicine

## 2024-06-19 ENCOUNTER — Other Ambulatory Visit: Payer: Self-pay | Admitting: Family Medicine

## 2024-06-27 ENCOUNTER — Encounter: Payer: 59 | Admitting: Family Medicine

## 2024-06-27 ENCOUNTER — Encounter: Payer: Self-pay | Admitting: Family Medicine

## 2024-06-27 ENCOUNTER — Ambulatory Visit: Payer: Self-pay | Admitting: Family Medicine

## 2024-06-27 VITALS — BP 120/82 | HR 75 | Temp 97.5°F | Ht 73.5 in | Wt 211.6 lb

## 2024-06-27 DIAGNOSIS — M79641 Pain in right hand: Secondary | ICD-10-CM | POA: Diagnosis not present

## 2024-06-27 DIAGNOSIS — J310 Chronic rhinitis: Secondary | ICD-10-CM | POA: Diagnosis not present

## 2024-06-27 DIAGNOSIS — Z0001 Encounter for general adult medical examination with abnormal findings: Secondary | ICD-10-CM

## 2024-06-27 DIAGNOSIS — R739 Hyperglycemia, unspecified: Secondary | ICD-10-CM | POA: Diagnosis not present

## 2024-06-27 DIAGNOSIS — M79642 Pain in left hand: Secondary | ICD-10-CM | POA: Diagnosis not present

## 2024-06-27 DIAGNOSIS — R0981 Nasal congestion: Secondary | ICD-10-CM | POA: Diagnosis not present

## 2024-06-27 DIAGNOSIS — E785 Hyperlipidemia, unspecified: Secondary | ICD-10-CM

## 2024-06-27 DIAGNOSIS — E039 Hypothyroidism, unspecified: Secondary | ICD-10-CM

## 2024-06-27 DIAGNOSIS — G4733 Obstructive sleep apnea (adult) (pediatric): Secondary | ICD-10-CM

## 2024-06-27 DIAGNOSIS — Z125 Encounter for screening for malignant neoplasm of prostate: Secondary | ICD-10-CM

## 2024-06-27 DIAGNOSIS — I10 Essential (primary) hypertension: Secondary | ICD-10-CM

## 2024-06-27 LAB — CBC
HCT: 45 % (ref 39.0–52.0)
Hemoglobin: 15.2 g/dL (ref 13.0–17.0)
MCHC: 33.8 g/dL (ref 30.0–36.0)
MCV: 95.6 fl (ref 78.0–100.0)
Platelets: 242 K/uL (ref 150.0–400.0)
RBC: 4.71 Mil/uL (ref 4.22–5.81)
RDW: 13.3 % (ref 11.5–15.5)
WBC: 5.4 K/uL (ref 4.0–10.5)

## 2024-06-27 LAB — HEMOGLOBIN A1C: Hgb A1c MFr Bld: 5.5 % (ref 4.6–6.5)

## 2024-06-27 LAB — LIPID PANEL
Cholesterol: 205 mg/dL — ABNORMAL HIGH (ref 28–200)
HDL: 44.2 mg/dL
LDL Cholesterol: 136 mg/dL — ABNORMAL HIGH (ref 10–99)
NonHDL: 161.22
Total CHOL/HDL Ratio: 5
Triglycerides: 127 mg/dL (ref 10.0–149.0)
VLDL: 25.4 mg/dL (ref 0.0–40.0)

## 2024-06-27 LAB — COMPREHENSIVE METABOLIC PANEL WITH GFR
ALT: 26 U/L (ref 3–53)
AST: 21 U/L (ref 5–37)
Albumin: 4.2 g/dL (ref 3.5–5.2)
Alkaline Phosphatase: 46 U/L (ref 39–117)
BUN: 20 mg/dL (ref 6–23)
CO2: 29 meq/L (ref 19–32)
Calcium: 9.2 mg/dL (ref 8.4–10.5)
Chloride: 104 meq/L (ref 96–112)
Creatinine, Ser: 1.06 mg/dL (ref 0.40–1.50)
GFR: 77.83 mL/min
Glucose, Bld: 100 mg/dL — ABNORMAL HIGH (ref 70–99)
Potassium: 4.4 meq/L (ref 3.5–5.1)
Sodium: 140 meq/L (ref 135–145)
Total Bilirubin: 1 mg/dL (ref 0.2–1.2)
Total Protein: 6.7 g/dL (ref 6.0–8.3)

## 2024-06-27 LAB — TSH: TSH: 0.37 u[IU]/mL (ref 0.35–5.50)

## 2024-06-27 LAB — PSA: PSA: 0.63 ng/mL (ref 0.10–4.00)

## 2024-06-27 MED ORDER — IRBESARTAN 150 MG PO TABS: 150.0000 mg | ORAL_TABLET | Freq: Every day | ORAL | 3 refills | Status: AC

## 2024-06-27 NOTE — Assessment & Plan Note (Signed)
 Blood pressure at goal today on irbesartan  150 mg daily.  He did have occasional few readings that were low at home a few weeks ago but levels have since went back to normal.  He will monitor at home and let us  know if he has any symptomatic lows at home and we can decrease the dose if needed.

## 2024-06-27 NOTE — Assessment & Plan Note (Signed)
 Check lipids. Discussed lifestyle modifications.

## 2024-06-27 NOTE — Assessment & Plan Note (Signed)
Check TSH.  On Synthroid 150 mcg daily.

## 2024-06-27 NOTE — Assessment & Plan Note (Signed)
 Improving with use of dental device.  He is not using CPAP

## 2024-06-27 NOTE — Assessment & Plan Note (Signed)
 Still a persistent issue.  Discussed trial of medications or nasal sprays however he declined.  Also discussed referral to ENT however he declined as well.  May have some mild eustachian tube dysfunction.  He will let us  know if he changes his mind about medications or referrals.

## 2024-06-27 NOTE — Patient Instructions (Signed)
 It was very nice to see you today!  VISIT SUMMARY: Today, we reviewed your overall health during your annual physical exam. We discussed your joint and muscle pain, blood pressure, sleep apnea, nasal congestion, and stress management.  YOUR PLAN: PRIMARY HYPERTENSION: Your blood pressure has been low, likely due to recent weight gain and stress. -Monitor your blood pressure regularly. -Contact us  if you experience dizziness or symptoms of low blood pressure again.  OBSTRUCTIVE SLEEP APNEA: Your sleep quality has improved with the dental device. -Continue using the dental device to manage your sleep apnea.  CHRONIC RHINITIS AND NASAL CONGESTION: You have persistent nasal congestion, especially when lying down. -Consider using a saline nasal spray for relief. -Monitor your symptoms and consider seeing an ENT specialist if it worsens.  CHRONIC JOINT AND MUSCLE PAIN: You have generalized joint and muscle pain that affects your sleep. -Continue with chiropractic care. -Engage in regular physical activity to help manage the pain.  GENERAL HEALTH MAINTENANCE: Maintaining regular exercise, a healthy diet, and managing stress are important for your overall health. -Aim for 30 minutes of exercise per day. -Eat a diet rich in fruits, vegetables, and lean proteins. -Practice stress management techniques.  Return in about 1 year (around 06/27/2025) for Annual Physical.   Take care, Dr Kennyth  PLEASE NOTE:  If you had any lab tests, please let us  know if you have not heard back within a few days. You may see your results on mychart before we have a chance to review them but we will give you a call once they are reviewed by us .   If we ordered any referrals today, please let us  know if you have not heard from their office within the next week.   If you had any urgent prescriptions sent in today, please check with the pharmacy within an hour of our visit to make sure the prescription was  transmitted appropriately.   Please try these tips to maintain a healthy lifestyle:  Eat at least 3 REAL meals and 1-2 snacks per day.  Aim for no more than 5 hours between eating.  If you eat breakfast, please do so within one hour of getting up.   Each meal should contain half fruits/vegetables, one quarter protein, and one quarter carbs (no bigger than a computer mouse)  Cut down on sweet beverages. This includes juice, soda, and sweet tea.   Drink at least 1 glass of water with each meal and aim for at least 8 glasses per day  Exercise at least 150 minutes every week.     Preventive Care 19-6 Years Old, Male Preventive care refers to lifestyle choices and visits with your health care provider that can promote health and wellness. Preventive care visits are also called wellness exams. What can I expect for my preventive care visit? Counseling During your preventive care visit, your health care provider may ask about your: Medical history, including: Past medical problems. Family medical history. Current health, including: Emotional well-being. Home life and relationship well-being. Sexual activity. Lifestyle, including: Alcohol, nicotine or tobacco, and drug use. Access to firearms. Diet, exercise, and sleep habits. Safety issues such as seatbelt and bike helmet use. Sunscreen use. Work and work astronomer. Physical exam Your health care provider will check your: Height and weight. These may be used to calculate your BMI (body mass index). BMI is a measurement that tells if you are at a healthy weight. Waist circumference. This measures the distance around your waistline. This measurement also  tells if you are at a healthy weight and may help predict your risk of certain diseases, such as type 2 diabetes and high blood pressure. Heart rate and blood pressure. Body temperature. Skin for abnormal spots. What immunizations do I need?  Vaccines are usually given at various  ages, according to a schedule. Your health care provider will recommend vaccines for you based on your age, medical history, and lifestyle or other factors, such as travel or where you work. What tests do I need? Screening Your health care provider may recommend screening tests for certain conditions. This may include: Lipid and cholesterol levels. Diabetes screening. This is done by checking your blood sugar (glucose) after you have not eaten for a while (fasting). Hepatitis B test. Hepatitis C test. HIV (human immunodeficiency virus) test. STI (sexually transmitted infection) testing, if you are at risk. Lung cancer screening. Prostate cancer screening. Colorectal cancer screening. Talk with your health care provider about your test results, treatment options, and if necessary, the need for more tests. Follow these instructions at home: Eating and drinking  Eat a diet that includes fresh fruits and vegetables, whole grains, lean protein, and low-fat dairy products. Take vitamin and mineral supplements as recommended by your health care provider. Do not drink alcohol if your health care provider tells you not to drink. If you drink alcohol: Limit how much you have to 0-2 drinks a day. Know how much alcohol is in your drink. In the [place] one drink equals one 12 oz bottle of beer (355 mL), one 5 oz glass of wine (148 mL), or one 1 oz glass of hard liquor (44 mL). Lifestyle Brush your teeth every morning and night with fluoride toothpaste. Floss one time each day. Exercise for at least 30 minutes 5 or more days each week. Do not use any products that contain nicotine or tobacco. These products include cigarettes, chewing tobacco, and vaping devices, such as e-cigarettes. If you need help quitting, ask your health care provider. Do not use drugs. If you are sexually active, practice safe sex. Use a condom or other form of protection to prevent STIs. Take aspirin only as told by your health  care provider. Make sure that you understand how much to take and what form to take. Work with your health care provider to find out whether it is safe and beneficial for you to take aspirin daily. Find healthy ways to manage stress, such as: Meditation, yoga, or listening to music. Journaling. Talking to a trusted person. Spending time with friends and family. Minimize exposure to UV radiation to reduce your risk of skin cancer. Safety Always wear your seat belt while driving or riding in a vehicle. Do not drive: If you have been drinking alcohol. Do not ride with someone who has been drinking. When you are tired or distracted. While texting. If you have been using any mind-altering substances or drugs. Wear a helmet and other protective equipment during sports activities. If you have firearms in your house, make sure you follow all gun safety procedures. What's next? Go to your health care provider once a year for an annual wellness visit. Ask your health care provider how often you should have your eyes and teeth checked. Stay up to date on all vaccines. This information is not intended to replace advice given to you by your health care provider. Make sure you discuss any questions you have with your health care provider. Document Revised: 12/05/2020 Document Reviewed: 12/05/2020 Elsevier Patient Education  2024 [employer]

## 2024-06-27 NOTE — Progress Notes (Signed)
 Cholesterol is a little elevated but the rest of his labs are all at goal.  Do not need to make any adjustments to treatment plan at this time.  He should continue to work on diet and exercise and we can recheck everything in a year or so.

## 2024-06-27 NOTE — Assessment & Plan Note (Signed)
 Overall symptoms are manageable.  He has hand pain as well as muscle and joint pain in other sites that are likely related to osteoarthritis.  We did discuss referral to sports medicine orthopedics however he declined.

## 2024-06-27 NOTE — Progress Notes (Signed)
 "  Chief Complaint:  Steve Olson is a 59 y.o. male who presents today for his annual comprehensive physical exam.    Assessment/Plan:  Chronic Problems Addressed Today: Dyslipidemia Check lipids.  Discussed lifestyle modifications.  Hypothyroidism Check TSH.  On Synthroid  150 mcg daily.  Hypertension Blood pressure at goal today on irbesartan  150 mg daily.  He did have occasional few readings that were low at home a few weeks ago but levels have since went back to normal.  He will monitor at home and let us  know if he has any symptomatic lows at home and we can decrease the dose if needed.  OSA (obstructive sleep apnea) Improving with use of dental device.  He is not using CPAP  Rhinitis Still a persistent issue.  Discussed trial of medications or nasal sprays however he declined.  Also discussed referral to ENT however he declined as well.  May have some mild eustachian tube dysfunction.  He will let us  know if he changes his mind about medications or referrals.  Hand pain Overall symptoms are manageable.  He has hand pain as well as muscle and joint pain in other sites that are likely related to osteoarthritis.  We did discuss referral to sports medicine orthopedics however he declined.  Preventative Healthcare: Check labs.  Vaccines declined.  Due for colonoscopy in 4 years.  Patient Counseling(The following topics were reviewed and/or handout was given):  -Nutrition: Stressed importance of moderation in sodium/caffeine intake, saturated fat and cholesterol, caloric balance, sufficient intake of fresh fruits, vegetables, and fiber.  -Stressed the importance of regular exercise.   -Substance Abuse: Discussed cessation/primary prevention of tobacco, alcohol, or other drug use; driving or other dangerous activities under the influence; availability of treatment for abuse.   -Injury prevention: Discussed safety belts, safety helmets, smoke detector, smoking near bedding or upholstery.    -Sexuality: Discussed sexually transmitted diseases, partner selection, use of condoms, avoidance of unintended pregnancy and contraceptive alternatives.   -Dental health: Discussed importance of regular tooth brushing, flossing, and dental visits.  -Health maintenance and immunizations reviewed. Please refer to Health maintenance section.  Return to care in 1 year for next preventative visit.     Subjective:  HPI:  He has no acute complaints today. Patient is here today for his annual physical.  See assessment / plan for status of chronic conditions.  Discussed the use of AI scribe software for clinical note transcription with the patient, who gave verbal consent to proceed.  History of Present Illness Steve Olson is a 59 year old male who presents for an annual physical exam.  He experiences joint and muscle pain, including bilateral sciatic pain, which affects his sleep. He has been seeing a land and has had x-rays done. He suspects arthritis in his hands due to past basketball injuries but has no issues with typing or wrist pain.  About three months ago, he experienced dizziness upon standing quickly or bending over, which he attributes to low blood pressure. He monitored his blood pressure and noted readings as low as 97/61. He has gained 7-8 pounds over the holidays and is concerned about the impact of his blood pressure medication.  He has a history of sleep apnea and uses a dental device instead of CPAP. He has not had a new sleep study but uses a [employer] watch to track his sleep, which has been inconsistent due to sleeping in different places.  He reports persistent congestion, particularly noticeable when lying down, but it does  not significantly affect him during the day. He has not tried medication for this but uses herbal teas with honey and lemonade, which provide some relief.  He notes a difference in hearing when lying on his right side compared to his left, which  his wife attributes to selective hearing. He has not pursued further evaluation for this issue.  He discusses significant stress over the past year, including starting a new job, his daughter-in-law's accident, his father-in-law's health decline, and his grandchild's heart defect. He manages stress through prayer and support for his family.  He engages in physical activities such as walking, basketball, and golf, and has been seeing a chiropractor for back issues related to L4 and L5 vertebrae. He has adjusted his activities to manage his back pain.  He is mindful of his diet, aiming to make healthier choices despite challenges with cooking and stress eating. He previously lost weight by avoiding processed sugars and carbs, and eating salads and grilled chicken.       06/27/2024    7:55 AM  Depression screen PHQ 2/9  Decreased Interest 0  Down, Depressed, Hopeless 0  PHQ - 2 Score 0    There are no preventive care reminders to display for this patient.   ROS: Per HPI, otherwise a complete review of systems was negative.   PMH:  The following were reviewed and entered/updated in epic: Past Medical History:  Diagnosis Date   Allergy    Arthritis    Chronic kidney disease    Kidney Stone   Hypertension    Thyroid  disease    Patient Active Problem List   Diagnosis Date Noted   Nasal congestion 06/27/2024   OSA (obstructive sleep apnea) 06/22/2023   Premature ejaculation 01/14/2023   Alopecia 04/11/2022   Rhinitis 04/11/2022   Seborrheic dermatitis 04/11/2021   Dyslipidemia 04/11/2020   Hand pain 04/10/2020   Hypothyroidism 08/05/2013   Hypertension 07/13/2012   Past Surgical History:  Procedure Laterality Date   ANTERIOR CRUCIATE LIGAMENT REPAIR  2000   basketball injury    Family History  Problem Relation Age of Onset   Breast cancer Mother    Heart disease Father    Heart attack Paternal Grandfather    Colon cancer Neg Hx    Esophageal cancer Neg Hx    Rectal  cancer Neg Hx    Stomach cancer Neg Hx     Medications- reviewed and updated Current Outpatient Medications  Medication Sig Dispense Refill   levothyroxine  (SYNTHROID ) 150 MCG tablet Take 1 tablet (150 mcg total) by mouth daily. 90 tablet 3   tadalafil  (CIALIS ) 20 MG tablet Take 0.5-1 tablets (10-20 mg total) by mouth every other day as needed for erectile dysfunction. 30 tablet 3   irbesartan  (AVAPRO ) 150 MG tablet Take 1 tablet (150 mg total) by mouth daily. 90 tablet 3   No current facility-administered medications for this visit.    Allergies-reviewed and updated Allergies[1]  Social History   Socioeconomic History   Marital status: Married    Spouse name: Not on file   Number of children: 5   Years of education: Not on file   Highest education level: Not on file  Occupational History   Not on file  Tobacco Use   Smoking status: Never   Smokeless tobacco: Never  Vaping Use   Vaping status: Never Used  Substance and Sexual Activity   Alcohol use: No   Drug use: No   Sexual activity: Yes  Other Topics  Concern   Not on file  Social History Narrative   Right handed    Wears glasses    Drinks hot and cold tea and coke zero    Social Drivers of Health   Tobacco Use: Low Risk (06/27/2024)   Patient History    Smoking Tobacco Use: Never    Smokeless Tobacco Use: Never    Passive Exposure: Not on file  Financial Resource Strain: Not on file  Food Insecurity: Not on file  Transportation Needs: Not on file  Physical Activity: Not on file  Stress: Not on file  Social Connections: Not on file  Depression (PHQ2-9): Low Risk (06/27/2024)   Depression (PHQ2-9)    PHQ-2 Score: 0  Alcohol Screen: Not on file  Housing: Not on file  Utilities: Not on file  Health Literacy: Not on file        Objective:  Physical Exam: BP 120/82   Pulse 75   Temp (!) 97.5 F (36.4 C) (Temporal)   Ht 6' 1.5 (1.867 m)   Wt 211 lb 9.6 oz (96 kg)   SpO2 96%   BMI 27.54 kg/m   Body  mass index is 27.54 kg/m. Wt Readings from Last 3 Encounters:  06/27/24 211 lb 9.6 oz (96 kg)  02/17/24 205 lb (93 kg)  07/22/23 208 lb 12.8 oz (94.7 kg)   Gen: NAD, resting comfortably HEENT: TMs normal bilaterally. OP clear. No thyromegaly noted.  CV: RRR with no murmurs appreciated Pulm: NWOB, CTAB with no crackles, wheezes, or rhonchi GI: Normal bowel sounds present. Soft, Nontender, Nondistended. MSK: no edema, cyanosis, or clubbing noted Skin: warm, dry Neuro: CN2-12 grossly intact. Strength 5/5 in upper and lower extremities. Reflexes symmetric and intact bilaterally.  Psych: Normal affect and thought content     Terrick Allred M. Kennyth, MD 06/27/2024 8:36 AM     [1]  Allergies Allergen Reactions   Penicillins Other (See Comments)    Family allergic pt does not take   "

## 2024-07-13 ENCOUNTER — Other Ambulatory Visit: Payer: Self-pay | Admitting: Family

## 2025-07-03 ENCOUNTER — Encounter: Payer: Self-pay | Admitting: Family Medicine
